# Patient Record
Sex: Female | Born: 2005 | Race: White | Hispanic: No | Marital: Single | Smoking: Never smoker
Health system: Southern US, Community
[De-identification: ages and names within clinical notes are randomized; demographics above are authoritative.]

## PROBLEM LIST (undated history)

## (undated) DIAGNOSIS — B999 Unspecified infectious disease: Secondary | ICD-10-CM

## (undated) DIAGNOSIS — Z789 Other specified health status: Secondary | ICD-10-CM

## (undated) HISTORY — DX: Unspecified infectious disease: B99.9

## (undated) HISTORY — DX: Other specified health status: Z78.9

## (undated) HISTORY — PX: NO PAST SURGERIES: SHX2092

---
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
## 2024-01-08 ENCOUNTER — Encounter: Payer: Self-pay | Admitting: Family Medicine

## 2024-01-08 DIAGNOSIS — O365990 Maternal care for other known or suspected poor fetal growth, unspecified trimester, not applicable or unspecified: Secondary | ICD-10-CM | POA: Insufficient documentation

## 2024-01-12 ENCOUNTER — Ambulatory Visit

## 2024-01-15 ENCOUNTER — Encounter: Payer: Self-pay | Admitting: Nurse Practitioner

## 2024-01-15 ENCOUNTER — Ambulatory Visit: Admitting: Nurse Practitioner

## 2024-01-15 VITALS — BP 99/59 | HR 88 | Temp 98.7°F | Wt 153.2 lb

## 2024-01-15 DIAGNOSIS — R825 Elevated urine levels of drugs, medicaments and biological substances: Secondary | ICD-10-CM

## 2024-01-15 DIAGNOSIS — Z3400 Encounter for supervision of normal first pregnancy, unspecified trimester: Secondary | ICD-10-CM

## 2024-01-15 DIAGNOSIS — Z3403 Encounter for supervision of normal first pregnancy, third trimester: Secondary | ICD-10-CM

## 2024-01-15 DIAGNOSIS — Z3A37 37 weeks gestation of pregnancy: Secondary | ICD-10-CM

## 2024-01-15 DIAGNOSIS — R9389 Abnormal findings on diagnostic imaging of other specified body structures: Secondary | ICD-10-CM

## 2024-01-15 DIAGNOSIS — O365990 Maternal care for other known or suspected poor fetal growth, unspecified trimester, not applicable or unspecified: Secondary | ICD-10-CM

## 2024-01-15 NOTE — Progress Notes (Signed)
  [facility] [address], [address] [place] [address] Main phone: [phone]  Prenatal Visit  Subjective:  [name] is a 18 y.o. G1P0000 at [redacted]w[redacted]d being seen today for ongoing prenatal care.  She is currently monitored for the following issues for this high-risk pregnancy:   Patient Active Problem List   Diagnosis Date Noted   Intrauterine growth restriction (IUGR) affecting care of mother, EFW 8% 01/08/2024   Abnormal finding on ultrasound: single umbilical artery on 09/11/23 u/s 09/11/2023   Vapes nicotine containing substance 08/25/2023   Positive urine drug screen 07/28/23 +MJ; +MJ 08/25/23 08/03/2023   UTI (urinary tract infection) during pregnancy E. Coli 07/28/23 08/03/2023   Supervision of normal first pregnancy, antepartum 07/28/2023   Patient reports no complaints.  Contractions: Not present. Vag. Bleeding: None.  Movement: Present. Denies leaking of fluid/ROM.   The following portions of the patient's history were reviewed and updated as appropriate: allergies, current medications, past family history, past medical history, past social history, past surgical history and problem list. Problem list updated.  Objective:   Vitals:   01/15/24 1438  BP: (!) 99/59  Pulse: 88  Temp: 98.7 F (37.1 C)  Weight: 153 lb 3.2 oz (69.5 kg)    Fetal Status: Fetal Heart Rate (bpm): 140 Fundal Height: 35 cm Movement: Present  Presentation: Vertex  General:  Alert, oriented and cooperative. Patient is in no acute distress.  Skin: Skin is warm and dry. No rash noted.   Cardiovascular: Normal heart rate noted  Respiratory: Normal respiratory effort, no problems with respiration noted  Abdomen: Soft, gravid, appropriate for gestational age.  Pain/Pressure: Absent     Pelvic: Cervical exam deferred      Pt opts to self swab  Extremities: Normal range of motion.  Edema: None  Mental Status: Normal mood and affect.  Normal behavior. Normal judgment and thought content.   Assessment and Plan:  Pregnancy: G1P0000 at [redacted]w[redacted]d  1. Supervision of normal first pregnancy, antepartum (Primary) 37 lb 12.8 oz (17.1 kg) Gained 8lbs in 3 weeks Taking ASA and PNV (forgetting some days) Pt self swabbed  - GBS Culture - Chlamydia/GC NAA, Confirmation  2. [redacted] weeks gestation of pregnancy   3. Positive urine drug screen 07/28/23 +MJ; +MJ 08/25/23 Pt not using MJ anymore  4. Abnormal finding on ultrasound: single umbilical artery on 09/11/23 u/s BPP weekly w [facility] BPP 4/28: 8/8 Next BPP on 5/5  5. Poor fetal growth affecting management of mother, antepartum, single or unspecified fetus Sent IOL request papers for 39 weeks   Term labor symptoms and general obstetric precautions including but not limited to vaginal bleeding, contractions, leaking of fluid and fetal movement were reviewed in detail with the patient. Please refer to After Visit Summary for other counseling recommendations.  Return in about 1 week (around 01/22/2024) for Routine PNC.  No future appointments.  Detria Cummings K Sriya Kroeze, NP

## 2024-01-15 NOTE — Progress Notes (Signed)
 Reminded of upcoming appointment 05/05 @ [facility]. Undecided Pediatrician-patient states will call offices this week. Gery Kubas RN

## 2024-01-19 LAB — CULTURE, BETA STREP (GROUP B ONLY): Strep Gp B Culture: NEGATIVE

## 2024-01-19 LAB — CHLAMYDIA/GC NAA, CONFIRMATION
Chlamydia trachomatis, NAA: NEGATIVE
Neisseria gonorrhoeae, NAA: NEGATIVE

## 2024-01-20 ENCOUNTER — Telehealth: Payer: Self-pay

## 2024-01-20 NOTE — Telephone Encounter (Signed)
 Call to client and notified as per [facility] from [employer] [facility] scheduler, that IOL scheduled for 01/30/24. Counseled [facility] will notify her via phone call between 1100 - 1400 of arrival time. Client verbalized understanding. Ariel Begun, RN

## 2024-01-22 ENCOUNTER — Encounter: Payer: Self-pay | Admitting: Nurse Practitioner

## 2024-01-22 ENCOUNTER — Ambulatory Visit: Admitting: Nurse Practitioner

## 2024-01-22 VITALS — BP 98/62 | HR 84 | Temp 99.1°F | Wt 152.4 lb

## 2024-01-22 DIAGNOSIS — O365990 Maternal care for other known or suspected poor fetal growth, unspecified trimester, not applicable or unspecified: Secondary | ICD-10-CM

## 2024-01-22 DIAGNOSIS — Z3403 Encounter for supervision of normal first pregnancy, third trimester: Secondary | ICD-10-CM

## 2024-01-22 DIAGNOSIS — Z3400 Encounter for supervision of normal first pregnancy, unspecified trimester: Secondary | ICD-10-CM

## 2024-01-22 DIAGNOSIS — R9389 Abnormal findings on diagnostic imaging of other specified body structures: Secondary | ICD-10-CM

## 2024-01-22 DIAGNOSIS — Z3A38 38 weeks gestation of pregnancy: Secondary | ICD-10-CM

## 2024-01-22 NOTE — Progress Notes (Signed)
  [facility] [address] [place] [address] Main phone: [phone]  Prenatal Visit  Subjective:  [name] is a 18 y.o. G1P0000 at [redacted]w[redacted]d being seen today for ongoing prenatal care.  She is currently monitored for the following issues for this low-risk pregnancy:   Patient Active Problem List   Diagnosis Date Noted   Intrauterine growth restriction (IUGR) affecting care of mother, EFW 8% 01/08/2024   Abnormal finding on ultrasound: single umbilical artery on 09/11/23 u/s 09/11/2023   Vapes nicotine containing substance 08/25/2023   Positive urine drug screen 07/28/23 +MJ; +MJ 08/25/23 08/03/2023   UTI (urinary tract infection) during pregnancy E. Coli 07/28/23 08/03/2023   Supervision of normal first pregnancy, antepartum 07/28/2023   Patient reports backache.  Contractions: Not present. Vag. Bleeding: None.  Movement: Present. Denies leaking of fluid/ROM.   The following portions of the patient's history were reviewed and updated as appropriate: allergies, current medications, past family history, past medical history, past social history, past surgical history and problem list. Problem list updated.  Objective:   Vitals:   01/22/24 1551  BP: 98/62  Pulse: 84  Temp: 99.1 F (37.3 C)  Weight: 152 lb 6.4 oz (69.1 kg)    Fetal Status: Fetal Heart Rate (bpm): 150 Fundal Height: 36 cm Movement: Present  Presentation: Vertex  General:  Alert, oriented and cooperative. Patient is in no acute distress.  Skin: Skin is warm and dry. No rash noted.   Cardiovascular: Normal heart rate noted  Respiratory: Normal respiratory effort, no problems with respiration noted  Abdomen: Soft, gravid, appropriate for gestational age.  Pain/Pressure: Absent     Pelvic: Cervical exam deferred        Extremities: Normal range of motion.  Edema: Trace  Mental Status: Normal mood and affect. Normal behavior. Normal  judgment and thought content.   Assessment and Plan:  Pregnancy: G1P0000 at [redacted]w[redacted]d  1. Supervision of normal first pregnancy, antepartum (Primary) 37 lb (16.8 kg) Lost 1 lb in 1 week  Discussed pain control during birth including epidural, nitrous oxide, volunteer doula program at [employer], hydrotherapy, movement, counter pressure.  Rec stretching for backache  2. Abnormal finding on ultrasound: single umbilical artery on 09/11/23 u/s Induction scheduled 5/17  3. Poor fetal growth affecting management of mother, antepartum, single or unspecified fetus BPP at [facility] on 5/5: 8/8 with UA dopplers normal Next appt 5/12  4. [redacted] weeks gestation of pregnancy Reviewed tips to help naturally induce labor including nipple stimulation, sex, eating 7 dates/day, red raspberry leaf tea.  Pt is drinking the tea and walking. Discussed she could have a membrane sweep if desired at next appt    Term labor symptoms and general obstetric precautions including but not limited to vaginal bleeding, contractions, leaking of fluid and fetal movement were reviewed in detail with the patient. Please refer to After Visit Summary for other counseling recommendations.  Return in about 1 week (around 01/29/2024) for Routine PNC.  Future Appointments  Date Time Provider Department Center  01/28/2024  1:40 PM [facility] PROVIDER [facility] None    Dorna Gasman, NP

## 2024-01-26 ENCOUNTER — Encounter: Payer: Self-pay | Admitting: Obstetrics & Gynecology

## 2024-01-26 ENCOUNTER — Inpatient Hospital Stay
Admission: EM | Admit: 2024-01-26 | Discharge: 2024-01-30 | DRG: 787 | Disposition: A | Discharge status: Home / Self Care | Attending: Certified Nurse Midwife | Admitting: Certified Nurse Midwife

## 2024-01-26 ENCOUNTER — Other Ambulatory Visit: Payer: Self-pay

## 2024-01-26 DIAGNOSIS — O4290 Premature rupture of membranes, unspecified as to length of time between rupture and onset of labor, unspecified weeks of gestation: Secondary | ICD-10-CM | POA: Diagnosis present

## 2024-01-26 DIAGNOSIS — O365930 Maternal care for other known or suspected poor fetal growth, third trimester, not applicable or unspecified: Secondary | ICD-10-CM | POA: Diagnosis present

## 2024-01-26 DIAGNOSIS — O365990 Maternal care for other known or suspected poor fetal growth, unspecified trimester, not applicable or unspecified: Secondary | ICD-10-CM | POA: Diagnosis present

## 2024-01-26 DIAGNOSIS — N898 Other specified noninflammatory disorders of vagina: Secondary | ICD-10-CM | POA: Diagnosis present

## 2024-01-26 DIAGNOSIS — Z833 Family history of diabetes mellitus: Secondary | ICD-10-CM | POA: Diagnosis not present

## 2024-01-26 DIAGNOSIS — O4202 Full-term premature rupture of membranes, onset of labor within 24 hours of rupture: Secondary | ICD-10-CM | POA: Diagnosis not present

## 2024-01-26 DIAGNOSIS — O99324 Drug use complicating childbirth: Secondary | ICD-10-CM | POA: Diagnosis present

## 2024-01-26 DIAGNOSIS — R9389 Abnormal findings on diagnostic imaging of other specified body structures: Secondary | ICD-10-CM | POA: Diagnosis present

## 2024-01-26 DIAGNOSIS — D62 Acute posthemorrhagic anemia: Secondary | ICD-10-CM | POA: Diagnosis not present

## 2024-01-26 DIAGNOSIS — Z91013 Allergy to seafood: Secondary | ICD-10-CM | POA: Diagnosis not present

## 2024-01-26 DIAGNOSIS — Z3A38 38 weeks gestation of pregnancy: Secondary | ICD-10-CM | POA: Diagnosis not present

## 2024-01-26 DIAGNOSIS — F1290 Cannabis use, unspecified, uncomplicated: Secondary | ICD-10-CM | POA: Diagnosis present

## 2024-01-26 DIAGNOSIS — O4292 Full-term premature rupture of membranes, unspecified as to length of time between rupture and onset of labor: Secondary | ICD-10-CM | POA: Diagnosis present

## 2024-01-26 DIAGNOSIS — O9902 Anemia complicating childbirth: Secondary | ICD-10-CM | POA: Diagnosis present

## 2024-01-26 DIAGNOSIS — Z3403 Encounter for supervision of normal first pregnancy, third trimester: Secondary | ICD-10-CM | POA: Diagnosis not present

## 2024-01-26 DIAGNOSIS — Z3400 Encounter for supervision of normal first pregnancy, unspecified trimester: Principal | ICD-10-CM

## 2024-01-26 DIAGNOSIS — Z7982 Long term (current) use of aspirin: Secondary | ICD-10-CM

## 2024-01-26 DIAGNOSIS — O9903 Anemia complicating the puerperium: Secondary | ICD-10-CM | POA: Diagnosis not present

## 2024-01-26 DIAGNOSIS — O26899 Other specified pregnancy related conditions, unspecified trimester: Secondary | ICD-10-CM | POA: Diagnosis present

## 2024-01-26 LAB — URINE DRUG SCREEN, QUALITATIVE (ARMC ONLY)
Amphetamines, Ur Screen: NOT DETECTED
Barbiturates, Ur Screen: NOT DETECTED
Benzodiazepine, Ur Scrn: NOT DETECTED
Cannabinoid 50 Ng, Ur ~~LOC~~: POSITIVE — AB
Cocaine Metabolite,Ur ~~LOC~~: NOT DETECTED
MDMA (Ecstasy)Ur Screen: NOT DETECTED
Methadone Scn, Ur: NOT DETECTED
Opiate, Ur Screen: NOT DETECTED
Phencyclidine (PCP) Ur S: NOT DETECTED
Tricyclic, Ur Screen: NOT DETECTED

## 2024-01-26 LAB — ABO/RH: ABO/RH(D): A POS

## 2024-01-26 LAB — CBC
HCT: 35.7 % — ABNORMAL LOW (ref 36.0–46.0)
Hemoglobin: 11.8 g/dL — ABNORMAL LOW (ref 12.0–15.0)
MCH: 27.3 pg (ref 26.0–34.0)
MCHC: 33.1 g/dL (ref 30.0–36.0)
MCV: 82.6 fL (ref 80.0–100.0)
Platelets: 324 10*3/uL (ref 150–400)
RBC: 4.32 MIL/uL (ref 3.87–5.11)
RDW: 13.3 % (ref 11.5–15.5)
WBC: 12.7 10*3/uL — ABNORMAL HIGH (ref 4.0–10.5)
nRBC: 0 % (ref 0.0–0.2)

## 2024-01-26 LAB — TYPE AND SCREEN
ABO/RH(D): A POS
Antibody Screen: NEGATIVE

## 2024-01-26 LAB — RUPTURE OF MEMBRANE (ROM)PLUS: Rom Plus: POSITIVE

## 2024-01-26 MED ORDER — MISOPROSTOL 25 MCG QUARTER TABLET
ORAL_TABLET | ORAL | Status: AC
Start: 2024-01-26 — End: 2024-01-26
  Administered 2024-01-26: 25 ug via ORAL
  Filled 2024-01-26: qty 1

## 2024-01-26 MED ORDER — MISOPROSTOL 25 MCG QUARTER TABLET
ORAL_TABLET | ORAL | Status: AC
  Filled 2024-01-26: qty 2

## 2024-01-26 MED ORDER — MISOPROSTOL 200 MCG PO TABS
ORAL_TABLET | ORAL | Status: AC
  Administered 2024-01-26: 50 ug via ORAL
  Filled 2024-01-26: qty 4

## 2024-01-26 MED ORDER — MISOPROSTOL 50MCG HALF TABLET
50.0000 ug | ORAL_TABLET | ORAL | Status: DC
  Administered 2024-01-26: 50 ug via ORAL

## 2024-01-26 MED ORDER — LIDOCAINE HCL (PF) 1 % IJ SOLN
INTRAMUSCULAR | Status: AC
  Filled 2024-01-26: qty 30

## 2024-01-26 MED ORDER — OXYTOCIN BOLUS FROM INFUSION: 333.0000 mL | Freq: Once | INTRAVENOUS | Status: DC

## 2024-01-26 MED ORDER — SOD CITRATE-CITRIC ACID 500-334 MG/5ML PO SOLN: 30.0000 mL | ORAL | Status: DC | PRN

## 2024-01-26 MED ORDER — ACETAMINOPHEN 325 MG PO TABS: 650.0000 mg | ORAL_TABLET | ORAL | Status: DC | PRN

## 2024-01-26 MED ORDER — HYDROXYZINE HCL 25 MG PO TABS: 50.0000 mg | ORAL_TABLET | Freq: Four times a day (QID) | ORAL | Status: DC | PRN

## 2024-01-26 MED ORDER — LIDOCAINE HCL (PF) 1 % IJ SOLN: 30.0000 mL | INTRAMUSCULAR | Status: DC | PRN

## 2024-01-26 MED ORDER — OXYTOCIN 10 UNIT/ML IJ SOLN
INTRAMUSCULAR | Status: AC
  Filled 2024-01-26: qty 2

## 2024-01-26 MED ORDER — AMMONIA AROMATIC IN INHA
RESPIRATORY_TRACT | Status: AC
Start: 2024-01-26 — End: 2024-01-26
  Filled 2024-01-26: qty 10

## 2024-01-26 MED ORDER — TERBUTALINE SULFATE 1 MG/ML IJ SOLN
0.2500 mg | Freq: Once | INTRAMUSCULAR | Status: AC | PRN
  Administered 2024-01-27: 0.25 mg via SUBCUTANEOUS
  Filled 2024-01-26: qty 1

## 2024-01-26 MED ORDER — ONDANSETRON HCL 4 MG/2ML IJ SOLN
4.0000 mg | Freq: Four times a day (QID) | INTRAMUSCULAR | Status: DC | PRN
  Administered 2024-01-27: 4 mg via INTRAVENOUS
  Filled 2024-01-26: qty 2

## 2024-01-26 MED ORDER — OXYTOCIN-SODIUM CHLORIDE 30-0.9 UT/500ML-% IV SOLN
INTRAVENOUS | Status: AC
  Administered 2024-01-27: 2 m[IU]/min via INTRAVENOUS
  Filled 2024-01-26: qty 500

## 2024-01-26 MED ORDER — OXYTOCIN-SODIUM CHLORIDE 30-0.9 UT/500ML-% IV SOLN
2.5000 [IU]/h | INTRAVENOUS | Status: DC
  Administered 2024-01-27: 30 [IU] via INTRAVENOUS

## 2024-01-26 MED ORDER — OXYTOCIN-SODIUM CHLORIDE 30-0.9 UT/500ML-% IV SOLN
1.0000 m[IU]/min | INTRAVENOUS | Status: DC
  Administered 2024-01-27: 2 m[IU]/min via INTRAVENOUS
  Administered 2024-01-27: 1 m[IU]/min via INTRAVENOUS
  Filled 2024-01-26: qty 500

## 2024-01-26 MED ORDER — MISOPROSTOL 25 MCG QUARTER TABLET
25.0000 ug | ORAL_TABLET | ORAL | Status: DC
  Filled 2024-01-26 (×2): qty 1

## 2024-01-26 MED ORDER — LACTATED RINGERS IV SOLN
500.0000 mL | INTRAVENOUS | Status: AC | PRN
  Administered 2024-01-27: 250 mL via INTRAVENOUS
  Administered 2024-01-27: 1000 mL via INTRAVENOUS
  Administered 2024-01-27: 500 mL via INTRAVENOUS

## 2024-01-26 MED ORDER — FENTANYL CITRATE (PF) 100 MCG/2ML IJ SOLN
50.0000 ug | INTRAMUSCULAR | Status: DC | PRN
  Administered 2024-01-27 (×6): 100 ug via INTRAVENOUS
  Filled 2024-01-26 (×6): qty 2

## 2024-01-26 NOTE — Progress Notes (Signed)
 Labor Progress Note   ASSESSMENT/PLAN   [name] 18 y.o.   G1P0000  at [redacted]w[redacted]d here with PROM.  FWB:  - Fetal well being assessed:   Category I     GBS: - GBS negative  LABOR: - Now in latent labor, doing well. - Pain Management: labor support without medication, options reviewed - Discussed options with patient and will repeat misoprostol. - Anticipate SVD   Active Problems:   Abnormal finding on ultrasound: single umbilical artery on 09/11/23 u/s     Overview: Needs weekly NST or BPP from 36 weeks     Serial growth u/s q 4 wks from 32 wks     12/09/23 @ 32 weeks- EFW 16%, growth lag in extremities without bowing-      ordered follow up for 3 weeks      01/06/24- EFW 8%, BPP 8/8   Intrauterine growth restriction (IUGR) affecting care of mother, EFW 8%     Overview: 01/06/24- consult with MFM at [facility], EFW 8%, BPP 8/8, UA doppler normal          -weekly NST or BPP with UA Doppler weekly [ weekly BPP and dopplers      ordered]     -growth US  q3-4 weeks     Delivery at [redacted]w[redacted]d-[redacted]w[redacted]d with EFW 3-10%   Premature rupture of membranes   PROM (premature rupture of membranes)   SUBJECTIVE/OBJECTIVE   SUBJECTIVE: Feeling more cramping. Has eaten dinner. Partner & grandmother at bedside, supportive.    OBJECTIVE: Vital Signs: Patient Vitals for the past 12 hrs:  BP Temp Temp src Pulse Resp Height Weight  01/26/24 1801 111/62 -- -- 98 15 -- --  01/26/24 1759 -- 97.6 F (36.4 C) Oral -- -- -- --  01/26/24 1630 -- 98.2 F (36.8 C) Oral -- -- -- --  01/26/24 1415 -- 98.1 F (36.7 C) Oral -- -- -- --  01/26/24 1201 -- 98.1 F (36.7 C) Oral -- -- -- --  01/26/24 1026 -- -- -- -- -- 5\' 3"  (1.6 m) 69.4 kg  01/26/24 1025 113/63 -- -- 73 18 -- --    Last SVE:  Dilation: 1 Effacement (%): 60 Station: -2 Presentation: Vertex Exam by:: Binnie Buffalo, CNM -  , Rupture Date: 01/26/24, Rupture Time: 0700,    FHR:   - Mode: External  - Baseline Rate (A): 135 bpm  -    -  Characteristics (ie - accels, decels): Accelerations: 15 x 15  -    UTERINE ACTIVITY:   - Mode: Toco  - Contraction Frequency (min): 2-3/utd minutes

## 2024-01-26 NOTE — H&P (Signed)
 [facility] Labor & Delivery  History and Physical   HPI   Chief Complaint: gush of clear fluid at 7am  [name] is a 18 y.o. G1P0000 at [redacted]w[redacted]d who presents for possible rupture of membranes. She reports gush of clear fluid and continued leaking of clear fluid. Denies painful contractions, reports on & off back pain. Endorses fetal movement. Denies vaginal bleeding. Early & regular prenatal care at [facility], first visit at 12w.  Pregnancy complicated by single umbilical artery, fetal growth restriction.    Pregnancy Complications Patient Active Problem List   Diagnosis Date Noted   Premature rupture of membranes 01/26/2024   PROM (premature rupture of membranes) 01/26/2024   Intrauterine growth restriction (IUGR) affecting care of mother, EFW 8% 01/08/2024   Abnormal finding on ultrasound: single umbilical artery on 09/11/23 u/s 09/11/2023   Vapes nicotine containing substance 08/25/2023   Positive urine drug screen 07/28/23 +MJ; +MJ 08/25/23 08/03/2023   UTI (urinary tract infection) during pregnancy E. Coli 07/28/23 08/03/2023   Supervision of normal first pregnancy, antepartum 07/28/2023    Review of Systems A twelve point review of systems was negative except as stated in HPI.   HISTORY   Medications Medications Prior to Admission  Medication Sig Dispense Refill Last Dose/Taking   aspirin  EC 81 MG tablet Take 1 tablet (81 mg total) by mouth daily. Swallow whole. 120 tablet  Past Month   Prenatal Vit-Fe Fumarate-FA (MULTIVITAMIN-PRENATAL) 27-0.8 MG TABS tablet Take 1 tablet by mouth daily at 12 noon.   Past Month    Allergies is allergic to shellfish allergy.   OB History OB History  Gravida Para Term Preterm AB Living  1 0 0 0 0 0  SAB IAB Ectopic Multiple Live Births  0 0 0 0 0    # Outcome Date GA Lbr Len/2nd Weight Sex Type Anes PTL Lv  1 Current             Past Medical History Past Medical History:  Diagnosis Date    Infection    Patient denies medical problems     Past Surgical History Past Surgical History:  Procedure Laterality Date   NO PAST SURGERIES     NO PAST SURGERIES      Social History  reports that she has never smoked. She has been exposed to tobacco smoke. She has never used smokeless tobacco. She reports that she does not currently use alcohol. She reports that she does not currently use drugs after having used the following drugs: Marijuana.   Family History family history includes Alcohol abuse in her father and mother; Diabetes in her paternal grandmother; Healthy in her father, maternal grandfather, mother, paternal grandfather, and sister; High blood pressure in her paternal grandmother.   PHYSICAL EXAM   Vitals:   01/26/24 1025 01/26/24 1026 01/26/24 1201  BP: 113/63    Pulse: 73    Resp: 18    Temp:   98.1 F (36.7 C)  TempSrc:   Oral  Weight:  153 lb (69.4 kg)   Height:  5\' 3"  (1.6 m)     Constitutional: No acute distress, well appearing, and well nourished. Neurologic: She is alert and conversational.  Psychiatric: She has a normal mood and affect.  Musculoskeletal: Normal gait, grossly normal range of motion Cardiovascular: Normal rate.   Pulmonary/Chest: Normal work of breathing.  Gastrointestinal/Abdominal: Soft. Gravid. There is no tenderness.  Skin: Skin is warm and dry. No rash noted.  Genitourinary: Normal external  female genitalia.  SVE:   Dilation: 1 Effacement (%): 40 Station: -2 Presentation: Vertex Exam by:: Binnie Buffalo CNM   NST Interpretation Indication: SROM Baseline: 140 bpm Variability: moderate Accelerations: present Decelerations: absent Contractions: irregular, every 2-8 minutes Time noted:  See OBIX Impression: reactive Authenticated by: Forestine Igo    PRENATAL LABS FROM OB RESULTS CONSOLE  ABO, Rh: A/Positive/-- (11/12 1049) Antibody: Negative (11/12 1049) Rubella: 2.08 (11/12 1049) RPR: Non Reactive (03/04 1124)   HBsAg: Negative (11/12 1049)  HIV: Non Reactive (11/12 1049)  ZOX:WRUEAVWU/-- (05/02 1500)  ENCOUNTER LABS   Results for orders placed or performed during the hospital encounter of 01/26/24 (from the past 24 hours)  Rupture of Membrane (ROM) Plus     Status: None   Collection Time: 01/26/24 10:31 AM  Result Value Ref Range   Rom Plus POSITIVE         ASSESSMENT AND PLAN   [name] is a 18 y.o. G1P0000 at [redacted]w[redacted]d with EDD: 02/05/2024, by Last Menstrual Period admitted for rupture of membranes.  Misoprostol for ripening, pitocin as needed. Options for pain management reviewed.  Fetal Status: - cephalic presentation by Leopolds, SVE, ultrasound 5/5 - EFW: 2225g 8% at 36w by ultrasound 01/18/24 - CEFM due to fetal growth restriction - FHT currently cat I  Fetal growth restriction, Single umbilical artery - EFW 8%, normal dopplers - CEFM - Placenta to pathology - Notify peds team  Premature rupture of membranes - monitor for infection  GBS negative  Marijuana use in pregnancy - UDS on admission - Notify peds team    Labs/Immunizations: TDAP: 11/17/23, received prenatally Flu: 07/28/23, received prenatally Rubella: immune Varicella unknown HIV: NR Hep B: negative Hep C: NR RPR: NR GBS: negative  Lab Results  Component Value Date   HIV Non Reactive 07/28/2023     Postpartum Plan: - Feeding: Breast Milk - Contraception: plans Progesterone-only OCPs - Prenatal Care Provider: [facility]  Attending Dr. Everardo Hitch was immediately available for the care of the patient.    Future Appointments  Date Time Provider Department Center  01/28/2024  1:40 PM [facility] PROVIDER [facility] None

## 2024-01-26 NOTE — Progress Notes (Signed)
 Pt presents to L/D triage with reported SROM at 0700. Pt reports no bleeding and positive fetal movement.  Last intercourse 5/13. She reports occasional back pain, rated 1/10.  ROM+ positive. CNM notified and patient admitted to L/D.  SVE 1/40/-2. VSS.  A+  GBS-

## 2024-01-27 ENCOUNTER — Encounter
Admission: EM | Disposition: A | Discharge status: Home / Self Care | Payer: Self-pay | Source: Home / Self Care | Attending: Obstetrics & Gynecology

## 2024-01-27 ENCOUNTER — Inpatient Hospital Stay: Admitting: General Practice

## 2024-01-27 ENCOUNTER — Encounter: Payer: Self-pay | Admitting: Obstetrics & Gynecology

## 2024-01-27 DIAGNOSIS — D62 Acute posthemorrhagic anemia: Secondary | ICD-10-CM

## 2024-01-27 DIAGNOSIS — O365930 Maternal care for other known or suspected poor fetal growth, third trimester, not applicable or unspecified: Secondary | ICD-10-CM | POA: Diagnosis not present

## 2024-01-27 DIAGNOSIS — O99324 Drug use complicating childbirth: Secondary | ICD-10-CM | POA: Diagnosis not present

## 2024-01-27 DIAGNOSIS — O4202 Full-term premature rupture of membranes, onset of labor within 24 hours of rupture: Secondary | ICD-10-CM | POA: Diagnosis not present

## 2024-01-27 DIAGNOSIS — Z3403 Encounter for supervision of normal first pregnancy, third trimester: Secondary | ICD-10-CM | POA: Diagnosis not present

## 2024-01-27 DIAGNOSIS — O9903 Anemia complicating the puerperium: Secondary | ICD-10-CM

## 2024-01-27 DIAGNOSIS — F1290 Cannabis use, unspecified, uncomplicated: Secondary | ICD-10-CM

## 2024-01-27 LAB — RPR: RPR Ser Ql: NONREACTIVE

## 2024-01-27 SURGERY — Surgical Case
Anesthesia: Epidural

## 2024-01-27 MED ORDER — MORPHINE SULFATE (PF) 0.5 MG/ML IJ SOLN
INTRAMUSCULAR | Status: DC | PRN
  Administered 2024-01-27: 1.5 mg via EPIDURAL
  Administered 2024-01-27: 1 mg via EPIDURAL

## 2024-01-27 MED ORDER — MENTHOL 3 MG MT LOZG: 1.0000 | LOZENGE | OROMUCOSAL | Status: DC | PRN

## 2024-01-27 MED ORDER — SOD CITRATE-CITRIC ACID 500-334 MG/5ML PO SOLN
ORAL | Status: AC
  Administered 2024-01-27: 30 mL via ORAL
  Filled 2024-01-27: qty 15

## 2024-01-27 MED ORDER — ENOXAPARIN SODIUM 40 MG/0.4ML IJ SOSY
40.0000 mg | PREFILLED_SYRINGE | INTRAMUSCULAR | Status: DC
Start: 2024-01-28 — End: 2024-01-30
  Administered 2024-01-28 – 2024-01-30 (×3): 40 mg via SUBCUTANEOUS
  Filled 2024-01-27 (×3): qty 0.4

## 2024-01-27 MED ORDER — LIDOCAINE 2% (20 MG/ML) 5 ML SYRINGE
INTRAMUSCULAR | Status: DC | PRN
  Administered 2024-01-27 (×3): 5 mL via INTRAVENOUS

## 2024-01-27 MED ORDER — BUPIVACAINE HCL (PF) 0.25 % IJ SOLN
INTRAMUSCULAR | Status: DC | PRN
  Administered 2024-01-27: 60 mL

## 2024-01-27 MED ORDER — ONDANSETRON HCL 4 MG/2ML IJ SOLN: 4.0000 mg | Freq: Three times a day (TID) | INTRAMUSCULAR | Status: DC | PRN

## 2024-01-27 MED ORDER — CARBOPROST TROMETHAMINE 250 MCG/ML IM SOLN
INTRAMUSCULAR | Status: AC
  Filled 2024-01-27: qty 1

## 2024-01-27 MED ORDER — CHLORHEXIDINE GLUCONATE 0.12 % MT SOLN
OROMUCOSAL | Status: AC
Start: 2024-01-27 — End: 2024-01-27
  Administered 2024-01-27: 15 mL
  Filled 2024-01-27: qty 15

## 2024-01-27 MED ORDER — GABAPENTIN 300 MG PO CAPS
300.0000 mg | ORAL_CAPSULE | Freq: Once | ORAL | Status: AC
  Administered 2024-01-27: 300 mg via ORAL
  Filled 2024-01-27: qty 1

## 2024-01-27 MED ORDER — PHENYLEPHRINE 80 MCG/ML (10ML) SYRINGE FOR IV PUSH (FOR BLOOD PRESSURE SUPPORT): 80.0000 ug | PREFILLED_SYRINGE | INTRAVENOUS | Status: DC | PRN

## 2024-01-27 MED ORDER — MEPERIDINE HCL 25 MG/ML IJ SOLN: 6.2500 mg | INTRAMUSCULAR | Status: DC | PRN

## 2024-01-27 MED ORDER — KETOROLAC TROMETHAMINE 30 MG/ML IJ SOLN
30.0000 mg | Freq: Four times a day (QID) | INTRAMUSCULAR | Status: AC
  Administered 2024-01-28 (×3): 30 mg via INTRAVENOUS
  Filled 2024-01-27 (×5): qty 1

## 2024-01-27 MED ORDER — KETOROLAC TROMETHAMINE 30 MG/ML IJ SOLN
INTRAMUSCULAR | Status: DC | PRN
Start: 2024-01-27 — End: 2024-01-27
  Administered 2024-01-27: 30 mg via INTRAVENOUS

## 2024-01-27 MED ORDER — SIMETHICONE 80 MG PO CHEW
80.0000 mg | CHEWABLE_TABLET | ORAL | Status: DC | PRN
  Administered 2024-01-29: 80 mg via ORAL
  Filled 2024-01-27: qty 1

## 2024-01-27 MED ORDER — MORPHINE SULFATE (PF) 0.5 MG/ML IJ SOLN
INTRAMUSCULAR | Status: DC | PRN
  Administered 2024-01-27: 2.5 mg via INTRAVENOUS

## 2024-01-27 MED ORDER — KETOROLAC TROMETHAMINE 30 MG/ML IJ SOLN
30.0000 mg | Freq: Four times a day (QID) | INTRAMUSCULAR | Status: AC | PRN
  Administered 2024-01-27 – 2024-01-28 (×2): 30 mg via INTRAVENOUS

## 2024-01-27 MED ORDER — MISOPROSTOL 200 MCG PO TABS
ORAL_TABLET | ORAL | Status: AC
  Filled 2024-01-27: qty 5

## 2024-01-27 MED ORDER — CEFAZOLIN SODIUM-DEXTROSE 2-4 GM/100ML-% IV SOLN
2.0000 g | INTRAVENOUS | Status: AC
Start: 2024-01-27 — End: 2024-01-27
  Administered 2024-01-27: 2 g via INTRAVENOUS

## 2024-01-27 MED ORDER — PROMETHAZINE HCL 25 MG/ML IJ SOLN
INTRAMUSCULAR | Status: AC
  Filled 2024-01-27: qty 1

## 2024-01-27 MED ORDER — SOD CITRATE-CITRIC ACID 500-334 MG/5ML PO SOLN: 30.0000 mL | ORAL | Status: AC

## 2024-01-27 MED ORDER — DEXMEDETOMIDINE HCL IN NACL 80 MCG/20ML IV SOLN
INTRAVENOUS | Status: DC | PRN
  Administered 2024-01-27: 12 ug via INTRAVENOUS
  Administered 2024-01-27: 8 ug via INTRAVENOUS

## 2024-01-27 MED ORDER — BUPIVACAINE HCL (PF) 0.25 % IJ SOLN
INTRAMUSCULAR | Status: AC
  Filled 2024-01-27: qty 60

## 2024-01-27 MED ORDER — OXYCODONE HCL 5 MG PO TABS: 5.0000 mg | ORAL_TABLET | ORAL | Status: DC | PRN

## 2024-01-27 MED ORDER — SIMETHICONE 80 MG PO CHEW
80.0000 mg | CHEWABLE_TABLET | Freq: Three times a day (TID) | ORAL | Status: DC
  Administered 2024-01-28 – 2024-01-30 (×6): 80 mg via ORAL
  Filled 2024-01-27 (×7): qty 1

## 2024-01-27 MED ORDER — PHENYLEPHRINE HCL-NACL 20-0.9 MG/250ML-% IV SOLN
INTRAVENOUS | Status: AC
  Filled 2024-01-27: qty 250

## 2024-01-27 MED ORDER — COCONUT OIL OIL
1.0000 | TOPICAL_OIL | Status: DC | PRN
  Filled 2024-01-27 (×2): qty 7.5

## 2024-01-27 MED ORDER — OXYTOCIN-SODIUM CHLORIDE 30-0.9 UT/500ML-% IV SOLN
2.5000 [IU]/h | INTRAVENOUS | Status: AC
  Administered 2024-01-27: 2.5 [IU]/h via INTRAVENOUS

## 2024-01-27 MED ORDER — DEXAMETHASONE SODIUM PHOSPHATE 10 MG/ML IJ SOLN
INTRAMUSCULAR | Status: DC | PRN
  Administered 2024-01-27: 10 mg via INTRAVENOUS

## 2024-01-27 MED ORDER — SODIUM CHLORIDE 0.9 % IV SOLN
INTRAVENOUS | Status: DC | PRN
  Administered 2024-01-27 (×2): 4 mL via EPIDURAL

## 2024-01-27 MED ORDER — SCOPOLAMINE 1 MG/3DAYS TD PT72: 1.0000 | MEDICATED_PATCH | Freq: Once | TRANSDERMAL | Status: DC

## 2024-01-27 MED ORDER — SODIUM CHLORIDE (PF) 0.9 % IJ SOLN
INTRAMUSCULAR | Status: AC
  Filled 2024-01-27: qty 50

## 2024-01-27 MED ORDER — ACETAMINOPHEN 500 MG PO TABS
1000.0000 mg | ORAL_TABLET | Freq: Once | ORAL | Status: AC
  Administered 2024-01-27: 1000 mg via ORAL
  Filled 2024-01-27: qty 2

## 2024-01-27 MED ORDER — PRENATAL MULTIVITAMIN CH
1.0000 | ORAL_TABLET | Freq: Every day | ORAL | Status: DC
  Administered 2024-01-28 – 2024-01-30 (×3): 1 via ORAL
  Filled 2024-01-27 (×3): qty 1

## 2024-01-27 MED ORDER — FENTANYL-BUPIVACAINE-NACL 0.5-0.125-0.9 MG/250ML-% EP SOLN
EPIDURAL | Status: AC
  Filled 2024-01-27: qty 250

## 2024-01-27 MED ORDER — DIBUCAINE (PERIANAL) 1 % EX OINT: 1.0000 | TOPICAL_OINTMENT | CUTANEOUS | Status: DC | PRN

## 2024-01-27 MED ORDER — IBUPROFEN 600 MG PO TABS
600.0000 mg | ORAL_TABLET | Freq: Four times a day (QID) | ORAL | Status: DC
Start: 2024-01-29 — End: 2024-01-30
  Administered 2024-01-29 – 2024-01-30 (×7): 600 mg via ORAL
  Filled 2024-01-27 (×10): qty 1

## 2024-01-27 MED ORDER — ACETAMINOPHEN 500 MG PO TABS
1000.0000 mg | ORAL_TABLET | Freq: Four times a day (QID) | ORAL | Status: DC
  Administered 2024-01-27 – 2024-01-30 (×10): 1000 mg via ORAL
  Filled 2024-01-27 (×9): qty 2

## 2024-01-27 MED ORDER — LACTATED RINGERS IV SOLN
500.0000 mL | Freq: Once | INTRAVENOUS | Status: AC
  Administered 2024-01-27: 500 mL via INTRAVENOUS

## 2024-01-27 MED ORDER — PHENYLEPHRINE HCL-NACL 20-0.9 MG/250ML-% IV SOLN
INTRAVENOUS | Status: DC | PRN
  Administered 2024-01-27: 40 ug/min via INTRAVENOUS

## 2024-01-27 MED ORDER — METHYLERGONOVINE MALEATE 0.2 MG/ML IJ SOLN
INTRAMUSCULAR | Status: DC | PRN
  Administered 2024-01-27: .2 mg via INTRAMUSCULAR

## 2024-01-27 MED ORDER — DIPHENHYDRAMINE HCL 50 MG/ML IJ SOLN
12.5000 mg | INTRAMUSCULAR | Status: DC | PRN
  Administered 2024-01-27: 12.5 mg via INTRAVENOUS
  Filled 2024-01-27: qty 1

## 2024-01-27 MED ORDER — NALOXONE HCL 4 MG/10ML IJ SOLN: 1.0000 ug/kg/h | INTRAVENOUS | Status: DC | PRN

## 2024-01-27 MED ORDER — MORPHINE SULFATE (PF) 0.5 MG/ML IJ SOLN
INTRAMUSCULAR | Status: AC
  Filled 2024-01-27: qty 10

## 2024-01-27 MED ORDER — DIPHENHYDRAMINE HCL 50 MG/ML IJ SOLN: 12.5000 mg | INTRAMUSCULAR | Status: DC | PRN

## 2024-01-27 MED ORDER — SENNOSIDES-DOCUSATE SODIUM 8.6-50 MG PO TABS
2.0000 | ORAL_TABLET | Freq: Every day | ORAL | Status: DC
  Administered 2024-01-28 – 2024-01-30 (×3): 2 via ORAL
  Filled 2024-01-27 (×3): qty 2

## 2024-01-27 MED ORDER — EPHEDRINE 5 MG/ML INJ: 10.0000 mg | INTRAVENOUS | Status: DC | PRN

## 2024-01-27 MED ORDER — PHENYLEPHRINE 80 MCG/ML (10ML) SYRINGE FOR IV PUSH (FOR BLOOD PRESSURE SUPPORT)
PREFILLED_SYRINGE | INTRAVENOUS | Status: DC | PRN
  Administered 2024-01-27: 80 ug via INTRAVENOUS

## 2024-01-27 MED ORDER — ONDANSETRON HCL 4 MG/2ML IJ SOLN
INTRAMUSCULAR | Status: DC | PRN
  Administered 2024-01-27: 4 mg via INTRAVENOUS

## 2024-01-27 MED ORDER — ACETAMINOPHEN 500 MG PO TABS
1000.0000 mg | ORAL_TABLET | Freq: Four times a day (QID) | ORAL | Status: AC
  Filled 2024-01-27 (×3): qty 2

## 2024-01-27 MED ORDER — ZOLPIDEM TARTRATE 5 MG PO TABS: 5.0000 mg | ORAL_TABLET | Freq: Every evening | ORAL | Status: DC | PRN

## 2024-01-27 MED ORDER — LACTATED RINGERS IV SOLN: INTRAVENOUS | Status: AC

## 2024-01-27 MED ORDER — FENTANYL-BUPIVACAINE-NACL 0.5-0.125-0.9 MG/250ML-% EP SOLN
12.0000 mL/h | EPIDURAL | Status: DC | PRN
  Administered 2024-01-27: 12 mL/h via EPIDURAL

## 2024-01-27 MED ORDER — DIPHENHYDRAMINE HCL 25 MG PO CAPS: 25.0000 mg | ORAL_CAPSULE | ORAL | Status: DC | PRN

## 2024-01-27 MED ORDER — LACTATED RINGERS AMNIOINFUSION
INTRAVENOUS | Status: DC
  Filled 2024-01-27 (×2): qty 1000

## 2024-01-27 MED ORDER — MORPHINE SULFATE (PF) 2 MG/ML IV SOLN: 1.0000 mg | INTRAVENOUS | Status: DC | PRN

## 2024-01-27 MED ORDER — CEFAZOLIN SODIUM-DEXTROSE 2-4 GM/100ML-% IV SOLN
INTRAVENOUS | Status: AC
  Filled 2024-01-27: qty 100

## 2024-01-27 MED ORDER — DIPHENHYDRAMINE HCL 25 MG PO CAPS: 25.0000 mg | ORAL_CAPSULE | Freq: Four times a day (QID) | ORAL | Status: DC | PRN

## 2024-01-27 MED ORDER — SODIUM CHLORIDE 0.9 % IV SOLN
500.0000 mg | INTRAVENOUS | Status: AC
  Administered 2024-01-27: 500 mg via INTRAVENOUS
  Filled 2024-01-27: qty 5

## 2024-01-27 MED ORDER — OXYCODONE HCL 5 MG PO TABS: 5.0000 mg | ORAL_TABLET | Freq: Four times a day (QID) | ORAL | Status: DC | PRN

## 2024-01-27 MED ORDER — NALOXONE HCL 0.4 MG/ML IJ SOLN: 0.4000 mg | INTRAMUSCULAR | Status: DC | PRN

## 2024-01-27 MED ORDER — METHYLERGONOVINE MALEATE 0.2 MG/ML IJ SOLN
INTRAMUSCULAR | Status: AC
  Filled 2024-01-27: qty 1

## 2024-01-27 MED ORDER — SODIUM CHLORIDE 0.9% FLUSH: 3.0000 mL | INTRAVENOUS | Status: DC | PRN

## 2024-01-27 MED ORDER — PROPOFOL 10 MG/ML IV BOLUS
INTRAVENOUS | Status: AC
  Filled 2024-01-27: qty 20

## 2024-01-27 MED ORDER — LIDOCAINE-EPINEPHRINE (PF) 1.5 %-1:200000 IJ SOLN
INTRAMUSCULAR | Status: DC | PRN
  Administered 2024-01-27: 3 mL via PERINEURAL

## 2024-01-27 MED ORDER — KETOROLAC TROMETHAMINE 30 MG/ML IJ SOLN: 30.0000 mg | Freq: Four times a day (QID) | INTRAMUSCULAR | Status: AC | PRN

## 2024-01-27 MED ORDER — WITCH HAZEL-GLYCERIN EX PADS: 1.0000 | MEDICATED_PAD | CUTANEOUS | Status: DC | PRN

## 2024-01-27 MED ORDER — GABAPENTIN 300 MG PO CAPS
300.0000 mg | ORAL_CAPSULE | Freq: Every day | ORAL | Status: DC
  Administered 2024-01-27 – 2024-01-29 (×3): 300 mg via ORAL
  Filled 2024-01-27 (×3): qty 1

## 2024-01-27 SURGICAL SUPPLY — 27 items
CHLORAPREP W/TINT 26 (MISCELLANEOUS) ×1 IMPLANT
DRSG TELFA 3X8 NADH (GAUZE/BANDAGES/DRESSINGS) ×1 IMPLANT
DRSG TELFA 3X8 NADH STRL (GAUZE/BANDAGES/DRESSINGS) ×1 IMPLANT
ELECT CAUTERY BLADE 6.4 (BLADE) ×1 IMPLANT
ELECTRODE REM PT RTRN 9FT ADLT (ELECTROSURGICAL) ×1 IMPLANT
GAUZE SPONGE 4X4 12PLY STRL (GAUZE/BANDAGES/DRESSINGS) ×1 IMPLANT
GLOVE SURG SYN 8.0 (GLOVE) ×1 IMPLANT
GLOVE SURG SYN 8.0 PF PI (GLOVE) ×1 IMPLANT
GOWN STRL REUS W/ TWL LRG LVL3 (GOWN DISPOSABLE) ×2 IMPLANT
GOWN STRL REUS W/ TWL XL LVL3 (GOWN DISPOSABLE) ×1 IMPLANT
MANIFOLD NEPTUNE II (INSTRUMENTS) ×1 IMPLANT
MAT PREVALON FULL STRYKER (MISCELLANEOUS) ×1 IMPLANT
NDL HYPO 22X1.5 SAFETY MO (MISCELLANEOUS) ×1 IMPLANT
NEEDLE HYPO 22X1.5 SAFETY MO (MISCELLANEOUS) ×1 IMPLANT
NS IRRIG 1000ML POUR BTL (IV SOLUTION) ×1 IMPLANT
PACK C SECTION AR (MISCELLANEOUS) ×1 IMPLANT
PAD DRESSING TELFA 3X8 NADH (GAUZE/BANDAGES/DRESSINGS) IMPLANT
PAD OB MATERNITY 11 LF (PERSONAL CARE ITEMS) ×1 IMPLANT
PAD PREP OB/GYN DISP 24X41 (PERSONAL CARE ITEMS) ×1 IMPLANT
SCRUB CHG 4% DYNA-HEX 4OZ (MISCELLANEOUS) ×1 IMPLANT
STRAP SAFETY 5IN WIDE (MISCELLANEOUS) ×1 IMPLANT
SUT CHROMIC 1 CTX 36 (SUTURE) ×3 IMPLANT
SUT PLAIN GUT 0 (SUTURE) ×2 IMPLANT
SUT VIC AB 0 CT1 36 (SUTURE) ×2 IMPLANT
SYR 30ML LL (SYRINGE) ×2 IMPLANT
TRAP FLUID SMOKE EVACUATOR (MISCELLANEOUS) ×1 IMPLANT
WATER STERILE IRR 500ML POUR (IV SOLUTION) ×1 IMPLANT

## 2024-01-27 NOTE — Progress Notes (Signed)
 Labor Progress Note   ASSESSMENT/PLAN   [name] 18 y.o.   G1P0000  at [redacted]w[redacted]d here for PROM and FGR.  FWB:  - Fetal well being assessed: Category 2        GBS: - GBS negative  LABOR: -  Early labor, recurrent decelerations - Amnioinfusion running - Pitocin infusing at 2 mU/min - Reviewed FHR tracing with Dr. Madelene Schanz. He recommends continuing to titrate the Pitocin for adequate contractions. - Pain Management: Epidural - Anticipate SVD   Labor Progress Cervical Exam  Last 3 exams Dil Eff Sta Exam Time  4.5 -- -- 1158  4 70 -1 0952  2 90 -1 0551      SUBJECTIVE/OBJECTIVE   SUBJECTIVE:  [name] is resting comfortably  OBJECTIVE: Vital Signs: Patient Vitals for the past 12 hrs:  BP Temp Temp src Pulse Resp SpO2  01/27/24 1136 (!) 91/48 -- -- 69 -- --  01/27/24 1130 -- 97.9 F (36.6 C) Oral -- -- --  01/27/24 1125 -- -- -- -- -- 92 %  01/27/24 1123 (!) 96/42 -- -- 67 -- --  01/27/24 1040 -- -- -- -- -- 100 %  01/27/24 1036 (!) 119/59 -- -- 81 -- --  01/27/24 1029 120/67 -- -- 90 -- --  01/27/24 1020 -- -- -- -- -- 98 %  01/27/24 0912 -- 98.1 F (36.7 C) Oral -- -- --  01/27/24 0754 (!) 96/45 -- -- 67 -- --  01/27/24 0720 (!) 87/48 -- -- 68 15 --  01/27/24 0719 -- 97.7 F (36.5 C) Oral -- -- --  01/27/24 0555 -- -- -- -- -- 99 %  01/27/24 0146 115/75 98 F (36.7 C) Oral 75 16 --    Last SVE:  Dilation: 4 Effacement (%): 70 Cervical Position: Middle Station: -1 Presentation: Vertex Exam by:: Cinda Craze CNM -  , Rupture Date: 01/26/24, Rupture Time: 0700,    FHR:   - Baseline: - Variability: moderate - Accels: present - Decels: variable, early Category 2  UTERINE ACTIVITY:  Contractions:  Phylliss Brenner, CNM

## 2024-01-27 NOTE — Progress Notes (Signed)
 Labor Progress Note   ASSESSMENT/PLAN   [name] 18 y.o.   G1P0000  at [redacted]w[redacted]d here for PROM and FGR.  FWB:  - Fetal well being assessed: Category 2        GBS: - GBS negative  LABOR: -  Cervical ripening - Reviewed patient with Dr. Madelene Schanz. P - Discussed recommendations for continuous fetal monitoring and accurate contraction tracing. Reviewed risks and benefits of potential interventions and obtained verbal consent. Cook catheter removed and internal monitors placed. Will get epidural placed and titrate Pitocin. Discussed potential for cesarean birth if baby does not tolerate labor. Consider amnioinfusion if indicated. - IUPC in place. FSE placed but is not tracing d/t faulty equipment. Will continue external fetal monitoring - Pain Management: IV pain meds and plans epidural - Anticipate SVD   Labor Progress Cervical Exam  Last 3 exams Dil Eff Sta Exam Time  4 70 -1 0952. Cook removed, IUPC placed  2 90 -1 0551  2.5 80 -1 0358     SUBJECTIVE/OBJECTIVE   SUBJECTIVE:  [name] is feeling painful contractions. She is tired and ready for her epidural.  OBJECTIVE: Vital Signs: Patient Vitals for the past 12 hrs:  BP Temp Temp src Pulse Resp SpO2  01/27/24 0912 -- 98.1 F (36.7 C) Oral -- -- --  01/27/24 0754 (!) 96/45 -- -- 67 -- --  01/27/24 0720 (!) 87/48 -- -- 68 15 --  01/27/24 0719 -- 97.7 F (36.5 C) Oral -- -- --  01/27/24 0555 -- -- -- -- -- 99 %  01/27/24 0146 115/75 98 F (36.7 C) Oral 75 16 --    Last SVE:  Dilation: 4 Effacement (%): 90 Cervical Position: Middle Station: -1 Presentation: Vertex Exam by:: [name] CNM -  , Rupture Date: 01/26/24, Rupture Time: 0700,    FHR:   - Baseline: 130 - Variability: moderate - Accels: present - Decels: variable/?late Category 2  UTERINE ACTIVITY:  Contractions:  q 6 min  Phylliss Brenner, CNM

## 2024-01-27 NOTE — Discharge Summary (Signed)
 Postpartum Discharge Summary  Date of Service updated 01/30/2024     Patient Name: [name] DOB: January 08, 2006 MRN: [id number]  Date of admission: 01/26/2024 Delivery date:01/27/2024 Delivering provider: Marvell Slider  Date of discharge: 01/30/2024  Admitting diagnosis: Vaginal discharge during pregnancy [O26.899, N89.8] PROM (premature rupture of membranes) [O42.90] Intrauterine pregnancy: [redacted]w[redacted]d     Secondary diagnosis:  Active Problems:   Abnormal finding on ultrasound: single umbilical artery on 09/11/23 u/s   Intrauterine growth restriction (IUGR) affecting care of mother, EFW 8%   Premature rupture of membranes   PROM (premature rupture of membranes)  Additional problems: Anemia in pregnancy    Discharge diagnosis: Term Pregnancy Delivered                                              Post partum procedures:iron  infusion Augmentation: Pitocin , Cytotec , and IP Foley Complications: fetal intolerance, prolonged ROM  Hospital course: Onset of Labor With Unplanned C/S   18 y.o. yo G1P1001 at [redacted]w[redacted]d was admitted in Latent Labor after PROM on 01/26/2024. Patient had a labor course significant for prolonged ROM and fetal intolerance. The patient went for cesarean section due to Non-Reassuring FHR. Delivery details as follows: Membrane Rupture Time/Date: 7:00 AM,01/26/2024  Delivery Method:C-Section, Low Transverse Operative Delivery:N/A Details of operation can be found in separate operative note. Patient had a postpartum course uncomplicated .  She is ambulating,tolerating a regular diet, passing flatus, and urinating well.  Patient is discharged home in stable condition 01/30/24.  Newborn Data: Birth date:01/27/2024 Birth time:1:47 PM Gender:Female Living status:Living Apgars:9 ,9  Weight:2380 g  Magnesium Sulfate received: No BMZ received: No Rhophylac:N/A MMR:N/A T-DaP:Given prenatally Flu: No RSV Vaccine received: No Transfusion:No Immunizations  administered: Immunization History  Administered Date(s) Administered   Influenza-Unspecified 07/28/2023   Tdap 11/17/2023    Physical exam  Vitals:   01/29/24 1608 01/29/24 1956 01/30/24 0005 01/30/24 0909  BP: 106/65 105/60 120/83 120/63  Pulse: 86 81 (!) 105 99  Resp: 18 18 18 20   Temp: 98.7 F (37.1 C) 98.6 F (37 C) 97.9 F (36.6 C) 98.7 F (37.1 C)  TempSrc: Oral Oral Oral Oral  SpO2: 99% 99% 100% 98%  Weight:      Height:       General: alert, cooperative, and no distress Lochia: appropriate Uterine Fundus: firm Incision: Dressing is clean, dry, and intact, honeycomb DVT Evaluation: No evidence of DVT seen on physical exam. Negative Homan's sign. No cords or calf tenderness. No significant calf/ankle edema. Labs: Lab Results  Component Value Date   WBC 16.1 (H) 01/28/2024   HGB 8.6 (L) 01/28/2024   HCT 27.1 (L) 01/28/2024   MCV 84.2 01/28/2024   PLT 251 01/28/2024       No data to display         Edinburgh Score:    01/28/2024    2:31 PM  Edinburgh Postnatal Depression Scale Screening Tool  I have been able to laugh and see the funny side of things. 0  I have looked forward with enjoyment to things. 0  I have blamed myself unnecessarily when things went wrong. 1  I have been anxious or worried for no good reason. 1  I have felt scared or panicky for no good reason. 0  Things have been getting on top of me. 1  I have been so  unhappy that I have had difficulty sleeping. 0  I have felt sad or miserable. 0  I have been so unhappy that I have been crying. 0  The thought of harming myself has occurred to me. 0  Edinburgh Postnatal Depression Scale Total 3      After visit meds:  Allergies as of 01/30/2024       Reactions   Shellfish Allergy Hives, Shortness Of Breath        Medication List     STOP taking these medications    aspirin  EC 81 MG tablet       TAKE these medications    multivitamin-prenatal 27-0.8 MG Tabs tablet Take 1  tablet by mouth daily at 12 noon.   oxyCODONE  5 MG immediate release tablet Commonly known as: Oxy IR/ROXICODONE  Take 1-2 tablets (5-10 mg total) by mouth every 4 (four) hours as needed for moderate pain (pain score 4-6).         Discharge home in stable condition Infant Feeding: Bottle and Breast Infant Disposition:home with mother Discharge instruction: per After Visit Summary and Postpartum booklet. Activity: Advance as tolerated. Pelvic rest for 6 weeks.  Diet: routine diet Anticipated Birth Control: POPs Postpartum Appointment:1 week Additional Postpartum F/U: Incision check 1 week Future Appointments: No future appointments.  Follow up Visit: With Melissa Swanson CNM       01/30/2024 Sorina Derrig, CNM

## 2024-01-27 NOTE — Progress Notes (Signed)
 S. She is starting to feel pain with her contractions, wants an epidural at some point.  O. VSS, AF FHR- 120s, good variabilty, occasional variable deceleration with initiation of pitocin. Pit stopped and Cook's balloon placed CVX- c/90/-2/vertex EFW- less than 5# Pelvis- adequate  A/P. IUGR- early labor We discussed the fetal heart decelerations and the possible need for a cesarean section in the future. Risks and benefits explained, understood and accepted.

## 2024-01-27 NOTE — Anesthesia Procedure Notes (Signed)
 Epidural Patient location during procedure: OB Start time: 01/27/2024 10:24 AM End time: 01/27/2024 10:27 AM  Staffing Anesthesiologist: Enrique Harvest, MD Performed: anesthesiologist   Preanesthetic Checklist Completed: patient identified, IV checked, site marked, risks and benefits discussed, surgical consent, monitors and equipment checked, pre-op evaluation and timeout performed  Epidural Patient position: sitting Prep: Betadine Patient monitoring: heart rate, continuous pulse ox and blood pressure Approach: midline Location: L3-L4 Injection technique: LOR saline  Needle:  Needle type: Tuohy  Needle gauge: 18 G Needle length: 9 cm and 9 Catheter type: closed end flexible Catheter size: 20 Guage Test dose: negative and 1.5% lidocaine with Epi 1:200 K  Assessment Sensory level: T4 Events: blood not aspirated, no cerebrospinal fluid, injection not painful, no injection resistance, no paresthesia and negative IV test  Additional Notes 1 attempt Pt. Evaluated and documentation done after procedure finished. Patient identified. Risks/Benefits/Options discussed with patient including but not limited to bleeding, infection, nerve damage, paralysis, failed block, incomplete pain control, headache, blood pressure changes, nausea, vomiting, reactions to medication both or allergic, itching and postpartum back pain. Confirmed with bedside nurse the patient's most recent platelet count. Confirmed with patient that they are not currently taking any anticoagulation, have any bleeding history or any family history of bleeding disorders. Patient expressed understanding and wished to proceed. All questions were answered. Sterile technique was used throughout the entire procedure. Please see nursing notes for vital signs. Test dose was given through epidural catheter and negative prior to continuing to dose epidural or start infusion. Warning signs of high block given to the patient including  shortness of breath, tingling/numbness in hands, complete motor block, or any concerning symptoms with instructions to call for help. Patient was given instructions on fall risk and not to get out of bed. All questions and concerns addressed with instructions to call with any issues or inadequate analgesia.    Patient tolerated the insertion well without immediate complications. Reason for block:procedure for pain

## 2024-01-27 NOTE — Progress Notes (Signed)
 Repetitive variable decelerations to the 60s noted. Pitocin discontinued, IV fluid bolus given. Amnioinfusion still running. Riannon repositioned. Dr. Madelene Schanz notified and requested to review FHR tracing.  Lion Fernandez M Meryn Sarracino, CNM

## 2024-01-27 NOTE — Lactation Note (Signed)
 This note was copied from a baby's chart. Lactation Consultation Note  Patient Name: [name] [id number] Date: 01/27/2024 Age:19 hours Reason for consult: Initial assessment;Primapara;Early term 37-38.6wks;Infant < 6lbs;RN request   Maternal Data This is 66 year old mom's 1st baby, primary C/S for FHR indication. Mom with history of MJ use 07/28/23 and 08/25/23 (UDS positive on admission), nicotine vapiong, fetal growth restriction.  Requested by care nurse to assist mom who was concerned she doesn't have enough milk and doesn't know if the baby is getting enough. Mom independently positioned and latched baby prior to [facility] arriving in room. Has patient been taught Hand Expression?: Yes Does the patient have breastfeeding experience prior to this delivery?: No  Feeding Mother's Current Feeding Choice: Breast Milk Provided tips and strategies on how to wake a sleepy baby and keep baby interested at the breast. Discussed how to know the baby is getting enough and what to expect in the first days when breastfeeding including 8-12 feeds in 24 hours, feeding cues, and cluster feeding.  LATCH Score Latch: Repeated attempts needed to sustain latch, nipple held in mouth throughout feeding, stimulation needed to elicit sucking reflex.  Audible Swallowing: A few with stimulation  Type of Nipple: Everted at rest and after stimulation  Comfort (Breast/Nipple): Soft / non-tender  Hold (Positioning): No assistance needed to correctly position infant at breast. (Mom had independently latched baby prior to [facility] entering the room.)  LATCH Score: 8   Interventions Interventions: Breast feeding basics reviewed;Education  Update provided to care nurse.  Consult Status Consult Status: Follow-up Date: 01/28/24 Follow-up type: In-patient    Angelica Kemp 01/27/2024, 9:59 PM

## 2024-01-27 NOTE — Progress Notes (Signed)
 Labor Progress Note   ASSESSMENT/PLAN   [name] 17 y.o.   G1P0000  at [redacted]w[redacted]d here for PROM and FGR.  FWB:  - Fetal well being assessed: Category 2        GBS: - GBS negative  LABOR: -  Latent labor, fetal intolerance of contractions - Recurrent deep variable with minimal cervical change. - Consulted Dr. Madelene Schanz to review FHR tracing. He recommends proceeding with cesarean birth at this time. [name] and her partner are in agreement with this plan. Questions answered. -Will give terbutaline and prophylactic antibiotics. - Pain Management: Epidural - Proceed with cesarean birth   SUBJECTIVE/OBJECTIVE   SUBJECTIVE:  Variable decels to the 60s-70s continue despite position changes, amnioinfusion, fluid bolus, and discontinuing Pitocin.   OBJECTIVE: Vital Signs: Patient Vitals for the past 12 hrs:  BP Temp Temp src Pulse Resp SpO2  01/27/24 1240 -- -- -- -- -- 100 %  01/27/24 1235 -- -- -- -- -- 100 %  01/27/24 1230 -- -- -- -- -- 99 %  01/27/24 1225 -- -- -- -- -- 99 %  01/27/24 1215 -- -- -- -- -- 100 %  01/27/24 1210 -- -- -- -- -- 100 %  01/27/24 1205 -- -- -- -- -- 100 %  01/27/24 1200 -- -- -- -- -- 100 %  01/27/24 1155 -- -- -- -- -- 100 %  01/27/24 1151 104/64 -- -- 69 -- --  01/27/24 1150 -- -- -- -- -- 100 %  01/27/24 1145 -- -- -- -- -- 100 %  01/27/24 1140 -- -- -- -- -- 100 %  01/27/24 1136 (!) 91/48 -- -- 69 -- --  01/27/24 1135 -- -- -- -- -- 100 %  01/27/24 1130 -- 97.9 F (36.6 C) Oral -- -- 100 %  01/27/24 1125 -- -- -- -- -- 92 %  01/27/24 1123 (!) 96/42 -- -- 67 -- --  01/27/24 1120 -- -- -- -- -- 96 %  01/27/24 1115 -- -- -- -- -- 91 %  01/27/24 1110 -- -- -- -- -- 100 %  01/27/24 1106 102/68 -- -- 73 -- --  01/27/24 1105 -- -- -- -- -- 100 %  01/27/24 1100 -- -- -- -- -- 100 %  01/27/24 1055 -- -- -- -- -- 100 %  01/27/24 1051 103/60 -- -- 71 -- --  01/27/24 1046 (!) 104/56 -- -- 75 -- --  01/27/24 1045 -- -- -- -- -- 100 %   01/27/24 1041 (!) 104/55 -- -- 66 -- --  01/27/24 1040 -- -- -- -- -- 100 %  01/27/24 1036 (!) 119/59 -- -- 81 -- --  01/27/24 1035 -- -- -- -- -- 99 %  01/27/24 1031 (!) 116/57 -- -- 79 -- --  01/27/24 1030 -- -- -- -- -- 96 %  01/27/24 1029 120/67 -- -- 90 -- --  01/27/24 1025 -- -- -- -- -- 98 %  01/27/24 1020 -- -- -- -- -- 98 %  01/27/24 1015 -- -- -- -- -- 99 %  01/27/24 0912 -- 98.1 F (36.7 C) Oral -- -- --  01/27/24 0754 (!) 96/45 -- -- 67 -- --  01/27/24 0720 (!) 87/48 -- -- 68 15 --  01/27/24 0719 -- 97.7 F (36.5 C) Oral -- -- --  01/27/24 0555 -- -- -- -- -- 99 %  01/27/24 0146 115/75 98 F (36.7 C) Oral 75 16 --    Last SVE:  Dilation: 4.5 Effacement (%): 70 Cervical Position: Middle Station: -1 Presentation: Vertex Exam by:: Cinda Craze CNM -  , Rupture Date: 01/26/24, Rupture Time: 0700,    FHR:   - Baseline: 125 - Variability: moderate - Accels: present - Decels: deep recurrent variables, prolonged Category 2  UTERINE ACTIVITY:  Contractions: q 2-6 minutes  Phylliss Brenner, CNM

## 2024-01-27 NOTE — Transfer of Care (Signed)
 Immediate Anesthesia Transfer of Care Note  Patient: [name]  Procedure(s) Performed: CESAREAN DELIVERY  Patient Location: Mother/Baby  Anesthesia Type:Epidural  Level of Consciousness: awake, alert , and oriented  Airway & Oxygen Therapy: Patient Spontanous Breathing  Post-op Assessment: Report given to RN and Post -op Vital signs reviewed and stable  Post vital signs: Reviewed and stable  Last Vitals:  Vitals Value Taken Time  BP 106/75 01/27/24 1429  Temp    Pulse 84 01/27/24 1429  Resp 14 01/27/24 1429  SpO2 98 % 01/27/24 1429    Last Pain:  Vitals:   01/27/24 1429  TempSrc:   PainSc: Asleep      Patients Stated Pain Goal: 0 (01/27/24 0052)  Complications: No notable events documented.

## 2024-01-27 NOTE — Progress Notes (Signed)
 Patient ID: [name], adult   DOB: 01-Feb-2006, 18 y.o.   MRN: [id number] Deep variable decels with every ctx  cx only 4.5 cm dilated .  ROM 30 hr  FITL . Recommend cesarean section . I have explained my thinking with the patient . She is in agreement .  The risks of cesarean section discussed with the patient included but were not limited to: bleeding which may require transfusion or reoperation; infection which may require antibiotics; injury to bowel, bladder, ureters or other surrounding organs; injury to the fetus; need for additional procedures including hysterectomy in the event of a life-threatening hemorrhage; placental abnormalities wth subsequent pregnancies, incisional problems, thromboembolic phenomenon and other postoperative/anesthesia complications. The patient concurred with the proposed plan, giving informed written consent for the procedure.    Anesthesia and OR aware. Preoperative prophylactic antibiotics and SCDs ordered on call to the OR.  To OR when ready.

## 2024-01-27 NOTE — Progress Notes (Signed)
 Patient ID: [name], adult   DOB: 16-May-2006, 18 y.o.   MRN: [id number] Assuming care with CNM  + cx change and now CAt 1 fetal monitoring . IUPC in place . Prolonged ROM . Add pitocin after CLe to gain adequate ctx

## 2024-01-27 NOTE — Brief Op Note (Signed)
 01/27/2024  2:13 PM  PATIENT:  Melda Spore  18 y.o. adult  PRE-OPERATIVE DIAGNOSIS:  Fetal intolerance to labor   POST-OPERATIVE DIAGNOSIS: FITL   SGA    PROCEDURE:  Procedure(s): CESAREAN DELIVERY (N/A)  SURGEON:  Surgeons and Role:    * Aricela Bertagnolli, Joselyn Nicely, MD - Primary  PHYSICIAN ASSISTANT: Tillie Folk, CNM   ASSISTANTS: cst   ANESTHESIA:   epidural  EBL: QBL 490 cc  IOF 700cc     BLOOD ADMINISTERED:none  DRAINS: none   LOCAL MEDICATIONS USED:  MARCAINE     SPECIMEN:  No Specimen  DISPOSITION OF SPECIMEN:  N/A  COUNTS:  YES  TOURNIQUET:  * No tourniquets in log *  DICTATION: .Other Dictation: Dictation Number verbal   PLAN OF CARE: Admit to inpatient   PATIENT DISPOSITION:  PACU - hemodynamically stable.   Delay start of Pharmacological VTE agent (>24hrs) due to surgical blood loss or risk of bleeding: not applicable

## 2024-01-27 NOTE — Progress Notes (Signed)
 Labor Progress Note   ASSESSMENT/PLAN   [name] 18 y.o.   G1P0000  at [redacted]w[redacted]d here for PROM.  FWB:  - Fetal well being assessed: Category 2        GBS: - GBS negative  LABOR: -  Cervical ripening - Cook catheter in place - Discussed options with Hadlyn. There have been some gaps in the FHR tracing d/t position changes, movement. Reviewed that we would like to get a better idea of the baby's wellbeing d/t recurrent variable decelerations. Will restart Pitocin after establishing a Cat I tracing -Reviewed plan with Dr. Madelene Schanz, who is in agreement. - Pain Management: plans epidural - Anticipate SVD   Labor Progress ervical Exam  Last 3 exams Dil Eff Sta Exam Time  2 90 -1 0551  2.5 80 -1 0358  1.5 70 -1 2256      SUBJECTIVE/OBJECTIVE   SUBJECTIVE:  [name] is coping well. She is feeling some painful contractions.   OBJECTIVE: Vital Signs: Patient Vitals for the past 12 hrs:  BP Temp Temp src Pulse Resp SpO2  01/27/24 0754 (!) 96/45 -- -- 67 -- --  01/27/24 0720 (!) 87/48 -- -- 68 15 --  01/27/24 0719 -- 97.7 F (36.5 C) Oral -- -- --  01/27/24 0555 -- -- -- -- -- 99 %  01/27/24 0146 115/75 98 F (36.7 C) Oral 75 16 --  01/26/24 2145 100/73 97.6 F (36.4 C) Oral 83 14 --    Last SVE:  Dilation: 2 Effacement (%): 90 Cervical Position: Middle Station: -1 Presentation: Vertex Exam by:: Dr. Everardo Hitch -  , Rupture Date: 01/26/24, Rupture Time: 0700,    FHR:   - Baseline: 140 - Variability: moderate - Accels: present - Decels: variable Category 2  UTERINE ACTI bVITY:  Contractions: q 2-5  Phylliss Brenner, CNM

## 2024-01-27 NOTE — Anesthesia Preprocedure Evaluation (Addendum)
 Anesthesia Evaluation  Patient identified by MRN, date of birth, ID band Patient awake    Reviewed: Allergy & Precautions, NPO status , Patient's Chart, lab work & pertinent test results  Airway Mallampati: III  TM Distance: >3 FB Neck ROM: full    Dental  (+) Chipped, Dental Advidsory Given   Pulmonary neg pulmonary ROS   Pulmonary exam normal        Cardiovascular Exercise Tolerance: Good negative cardio ROS Normal cardiovascular exam     Neuro/Psych    GI/Hepatic negative GI ROS,,,  Endo/Other    Renal/GU   negative genitourinary   Musculoskeletal   Abdominal   Peds  Hematology negative hematology ROS (+)   Anesthesia Other Findings Past Medical History: No date: Infection No date: Patient denies medical problems  Past Surgical History: No date: NO PAST SURGERIES No date: NO PAST SURGERIES  BMI    Body Mass Index: 27.10 kg/m      Reproductive/Obstetrics (+) Pregnancy                             Anesthesia Physical Anesthesia Plan  ASA: 2  Anesthesia Plan: Epidural   Post-op Pain Management:    Induction:   PONV Risk Score and Plan:   Airway Management Planned: Natural Airway  Additional Equipment:   Intra-op Plan:   Post-operative Plan:   Informed Consent: I have reviewed the patients History and Physical, chart, labs and discussed the procedure including the risks, benefits and alternatives for the proposed anesthesia with the patient or authorized representative who has indicated his/her understanding and acceptance.     Dental Advisory Given  Plan Discussed with: Anesthesiologist  Anesthesia Plan Comments: (Patient reports no bleeding problems and no anticoagulant use.   Patient consented for risks of anesthesia including but not limited to:  - adverse reactions to medications - risk of bleeding, infection and or nerve damage from epidural that  could lead to paralysis - risk of headache or failed epidural - nerve damage due to positioning - that if epidural is used for C-section that there is a chance of epidural failure requiring spinal placement or conversion to GA - Damage to heart, brain, lungs, other parts of body or loss of life  Patient voiced understanding and assent.)       Anesthesia Quick Evaluation

## 2024-01-28 ENCOUNTER — Encounter: Payer: Self-pay | Admitting: Obstetrics and Gynecology

## 2024-01-28 ENCOUNTER — Ambulatory Visit

## 2024-01-28 DIAGNOSIS — O4202 Full-term premature rupture of membranes, onset of labor within 24 hours of rupture: Secondary | ICD-10-CM | POA: Diagnosis not present

## 2024-01-28 DIAGNOSIS — O99324 Drug use complicating childbirth: Secondary | ICD-10-CM | POA: Diagnosis not present

## 2024-01-28 DIAGNOSIS — O365930 Maternal care for other known or suspected poor fetal growth, third trimester, not applicable or unspecified: Secondary | ICD-10-CM | POA: Diagnosis not present

## 2024-01-28 DIAGNOSIS — F1290 Cannabis use, unspecified, uncomplicated: Secondary | ICD-10-CM | POA: Diagnosis not present

## 2024-01-28 LAB — CBC
HCT: 27.1 % — ABNORMAL LOW (ref 36.0–46.0)
Hemoglobin: 8.6 g/dL — ABNORMAL LOW (ref 12.0–15.0)
MCH: 26.7 pg (ref 26.0–34.0)
MCHC: 31.7 g/dL (ref 30.0–36.0)
MCV: 84.2 fL (ref 80.0–100.0)
Platelets: 251 10*3/uL (ref 150–400)
RBC: 3.22 MIL/uL — ABNORMAL LOW (ref 3.87–5.11)
RDW: 13.5 % (ref 11.5–15.5)
WBC: 16.1 10*3/uL — ABNORMAL HIGH (ref 4.0–10.5)
nRBC: 0 % (ref 0.0–0.2)

## 2024-01-28 MED ORDER — IRON SUCROSE 500 MG IVPB - SIMPLE MED
500.0000 mg | Freq: Once | INTRAVENOUS | Status: AC
  Administered 2024-01-28: 500 mg via INTRAVENOUS
  Filled 2024-01-28: qty 275

## 2024-01-28 MED ORDER — SODIUM CHLORIDE 0.9 % IV SOLN: INTRAVENOUS | Status: AC | PRN

## 2024-01-28 MED ORDER — DIPHENHYDRAMINE HCL 50 MG/ML IJ SOLN: 25.0000 mg | Freq: Once | INTRAMUSCULAR | Status: DC | PRN

## 2024-01-28 MED ORDER — EPINEPHRINE 0.3 MG/0.3ML IJ SOAJ: 0.3000 mg | Freq: Once | INTRAMUSCULAR | Status: DC | PRN

## 2024-01-28 MED ORDER — METHYLPREDNISOLONE SODIUM SUCC 125 MG IJ SOLR: 125.0000 mg | Freq: Once | INTRAMUSCULAR | Status: DC | PRN

## 2024-01-28 MED ORDER — ALBUTEROL SULFATE (2.5 MG/3ML) 0.083% IN NEBU: 2.5000 mg | INHALATION_SOLUTION | Freq: Once | RESPIRATORY_TRACT | Status: DC | PRN

## 2024-01-28 MED ORDER — SODIUM CHLORIDE 0.9 % IV BOLUS: 500.0000 mL | Freq: Once | INTRAVENOUS | Status: DC | PRN

## 2024-01-28 NOTE — Anesthesia Postprocedure Evaluation (Signed)
 Anesthesia Post Note  Patient: [name]  Procedure(s) Performed: CESAREAN DELIVERY  Patient location during evaluation: Mother Baby Anesthesia Type: Epidural Level of consciousness: awake and alert Pain management: pain level controlled Vital Signs Assessment: post-procedure vital signs reviewed and stable Respiratory status: spontaneous breathing, nonlabored ventilation and respiratory function stable Cardiovascular status: stable Postop Assessment: no headache, no backache and epidural receding Anesthetic complications: no   No notable events documented.   Last Vitals:  Vitals:   01/28/24 0300 01/28/24 0509  BP: 97/62 108/87  Pulse: 63 66  Resp: 18 20  Temp: 37.1 C 36.8 C  SpO2: 100% 97%    Last Pain:  Vitals:   01/28/24 0555  TempSrc:   PainSc: 4                  Usiel Astarita [address]

## 2024-01-28 NOTE — Progress Notes (Signed)
 Subjective:   [name] had a c/s on 01/27/24 for fetal intolerance with IOL for PROM. Has had routine postpartum care.  Pt. Is eating, hydrating, and voiding regularly without difficulty. Has yet to have BM. She is breastfeeding and supplementing with formula afterwards, states this was recommended by her nurse. Baby had some trouble latching last night but has successfully latched today and [name] was seen by lactation today which she found helpful. Reports small amount of vaginal bleeding, denies passing large blood clots. Has had some incisional pain relieved with tylenol and toradol. Also reports some pain to right shoulder which she thinks is likely referred gas pain. Plans to use OCPs for contraception. Denies anxiety/depression symptoms. Endorses good support from partner and family.  Denies feeling light headed or dizzy with standing. Has been walking around her room. Reports a good appetite, no nausea today.  Objective:  Vital signs in last 24 hours: Temp:  [98.2 F (36.8 C)-99.8 F (37.7 C)] 98.6 F (37 C) (05/15 1553) Pulse Rate:  [63-84] 84 (05/15 1553) Resp:  [18-20] 18 (05/15 1553) BP: (97-115)/(53-87) 115/74 (05/15 1553) SpO2:  [97 %-100 %] 99 % (05/15 1553)    General: NAD Pulmonary: no increased work of breathing Breasts: soft, non-tender, nipples without breakdown Abdomen: soft, non-tender Fundus: firm, midline, at umbilicus Dressing: pressure dressing on, dry and intact Lochia: light rubra, no clots Perineum: no erythema or foul odor discharge, minimal edema, laceration well approximated  Extremities: no edema, no erythema, no tenderness  Results for orders placed or performed during the hospital encounter of 01/26/24 (from the past 72 hours)  Rupture of Membrane (ROM) Plus     Status: None   Collection Time: 01/26/24 10:31 AM  Result Value Ref Range   Rom Plus POSITIVE     Comment: Performed at [facility], [address],  [place], [place] [address]  Urine Drug Screen, Qualitative ([facility] only)     Status: Abnormal   Collection Time: 01/26/24 10:31 AM  Result Value Ref Range   Tricyclic, Ur Screen NONE DETECTED NONE DETECTED   Amphetamines, Ur Screen NONE DETECTED NONE DETECTED   MDMA (Ecstasy)Ur Screen NONE DETECTED NONE DETECTED   Cocaine Metabolite,Ur [place] NONE DETECTED NONE DETECTED   Opiate, Ur Screen NONE DETECTED NONE DETECTED   Phencyclidine (PCP) Ur S NONE DETECTED NONE DETECTED   Cannabinoid 50 Ng, Ur [place] POSITIVE (A) NONE DETECTED   Barbiturates, Ur Screen NONE DETECTED NONE DETECTED   Benzodiazepine, Ur Scrn NONE DETECTED NONE DETECTED   Methadone Scn, Ur NONE DETECTED NONE DETECTED    Comment: (NOTE) Tricyclics + metabolites, urine    Cutoff 1000 ng/mL Amphetamines + metabolites, urine  Cutoff 1000 ng/mL MDMA (Ecstasy), urine              Cutoff 500 ng/mL Cocaine Metabolite, urine          Cutoff 300 ng/mL Opiate + metabolites, urine        Cutoff 300 ng/mL Phencyclidine (PCP), urine         Cutoff 25 ng/mL Cannabinoid, urine                 Cutoff 50 ng/mL Barbiturates + metabolites, urine  Cutoff 200 ng/mL Benzodiazepine, urine              Cutoff 200 ng/mL Methadone, urine                   Cutoff 300 ng/mL  The urine  drug screen provides only a preliminary, unconfirmed analytical test result and should not be used for non-medical purposes. Clinical consideration and professional judgment should be applied to any positive drug screen result due to possible interfering substances. A more specific alternate chemical method must be used in order to obtain a confirmed analytical result. Gas chromatography / mass spectrometry (GC/MS) is the preferred confirm atory method. Performed at [facility], [address] [place], [place] [address]   CBC     Status: Abnormal   Collection Time: 01/26/24 12:19 PM  Result Value Ref Range   WBC 12.7 (H) 4.0 - 10.5 K/uL   RBC 4.32 3.87 - 5.11  MIL/uL   Hemoglobin 11.8 (L) 12.0 - 15.0 g/dL   HCT 27.2 (L) 53.6 - 64.4 %   MCV 82.6 80.0 - 100.0 fL   MCH 27.3 26.0 - 34.0 pg   MCHC 33.1 30.0 - 36.0 g/dL   RDW 03.4 74.2 - 59.5 %   Platelets 324 150 - 400 K/uL   nRBC 0.0 0.0 - 0.2 %    Comment: Performed at [facility], [address] [place], [place] [address]  Type and screen [facility]     Status: None   Collection Time: 01/26/24 12:19 PM  Result Value Ref Range   ABO/RH(D) A POS    Antibody Screen NEG    Sample Expiration      01/29/2024,2359 Performed at [facility], [address] [place], [place] [address]   RPR     Status: None   Collection Time: 01/26/24 12:19 PM  Result Value Ref Range   RPR Ser Ql NON REACTIVE NON REACTIVE    Comment: Performed at [facility], [address] [place], [place] [address]  ABO/Rh     Status: None   Collection Time: 01/26/24  1:10 PM  Result Value Ref Range   ABO/RH(D)      A POS Performed at [facility], [address] [place], [place] [address]   CBC     Status: Abnormal   Collection Time: 01/28/24  4:22 AM  Result Value Ref Range   WBC 16.1 (H) 4.0 - 10.5 K/uL   RBC 3.22 (L) 3.87 - 5.11 MIL/uL   Hemoglobin 8.6 (L) 12.0 - 15.0 g/dL    Comment: REPEATED TO VERIFY   HCT 27.1 (L) 36.0 - 46.0 %   MCV 84.2 80.0 - 100.0 fL   MCH 26.7 26.0 - 34.0 pg   MCHC 31.7 30.0 - 36.0 g/dL   RDW 63.0 16.0 - 10.9 %   Platelets 251 150 - 400 K/uL   nRBC 0.0 0.0 - 0.2 %    Comment: Performed at [facility], [address], [place], [place] [address]    Assessment:   18 y.o. G1P1001 1 day(s)  s/p c/s Breastfeeding/supplementing with formula Anemia secondary to acute blood loss- hemodynamically stable and symptomatic VSS Pain well controlled Positive UDS  Plan:    IV iron ordered for anemia, discussed with patient who is in agreement Blood Type --/--/A POS Performed at [facility], [address] [place], [place] [address]  (05/13 1310) / Rubella 2.08 (11/12 1049) / Varicella Unknown Rhogam not indicated Tdap/varicella/rubella to be offered before discharge if indicated Feeding plan breast & bottle, lactation support Encouraged to continue breastfeeding, BF education on latch, position changes, cluster feeding, hunger cues, lactogenesis II, milk supply Education given regarding options for contraception, as well  as compatibility with breast feeding if applicable.  Patient plans on OCP (estrogen/progesterone) for contraception. Continued routine postpartum care  Counseled on normal uterine involution and vaginal bleeding postpartum Transition of care ordered due to positive UDS for [facility] Anticipate discharge home tomorrow or possibly 01/30/24    Lou Rounds, FNP, CNM [facility] 01/28/2024, 5:43 PM

## 2024-01-28 NOTE — Lactation Note (Signed)
 This note was copied from a baby's chart. Lactation Consultation Note  Patient Name: Girl Essiemae Mater [id number] Date: 01/28/2024 Age:19 hours Reason for consult: Follow-up assessment;Primapara;Early term 37-38.6wks   Maternal Data Follow up assessment w/ 18hr old baby girl and parents.  Patient stated that she is currently supplementing with formula after infant goes to the breast.  She feels breastfeeding is going ok.    Feeding Mother's Current Feeding Choice: Breast Milk and Formula Nipple Type: Slow - flow  Patient just finished feeding infant w/ a bottle of formula prior to arrival in the room.   Interventions Interventions: Breast feeding basics reviewed;Education;CDC milk storage guidelines  LC reviewed milk production expectations to parents.  Breastmilk storage guidelines were provided to parents.   Consult Status Consult Status: Follow-up Follow-up type: In-patient    Evertt Chouinard S Curties Conigliaro 01/28/2024, 1:45 PM

## 2024-01-28 NOTE — Op Note (Signed)
 NAMESANII, [name] MEDICAL RECORD NO: [id number] ACCOUNT NO: [id number] DATE OF BIRTH: 11-Aug-2006 FACILITY: [facility] LOCATION: [facility] PHYSICIAN: Carolynn Citrin, MD  Operative Report   DATE OF PROCEDURE: 01/26/2024  PREOPERATIVE DIAGNOSES: 1. 38+6 weeks estimated gestational age. 2. Prolonged rupture of membranes. 3. Fetal intolerance of labor. 4. Small for gestational age, growth restriction.  POSTOPERATIVE DIAGNOSES: 1. 38+6 weeks estimated gestational age. 2. Prolonged rupture of membranes. 3. Fetal intolerance of labor. 4. Small for gestational age, growth restriction. 5. Vigorous female delivered.  PROCEDURE: Primary low transverse cesarean section.  ANESTHESIA: Surgical dosing of continuous lumbar epidural.  SURGEON: Carolynn Citrin, MD  FIRST ASSISTANT: Tillie Folk, certified nurse midwife.  INDICATIONS: An 18 year old gravida 1, para 0. The patient at 38+6 weeks estimated gestational age patient presented today prior to the procedure with rupture of membranes. The patient had prolonged rupture of membranes for 30 hours, and during labor,  early active labor, developed nonreassuring fetal monitoring with deep variable decelerations with contractions.  DESCRIPTION OF PROCEDURE: After adequate surgical dosing of continuous lumbar epidural, the patient was placed in dorsal supine position hip roll on the right side. The patient's vagina and abdomen were prepped and draped in normal sterile fashion. The  patient did receive 2 g of IV Ancef  and 500 mg of azithromycin  for surgical prophylaxis. Timeout was performed. A Pfannenstiel incision was made two fingerbreadths above the symphysis pubis. Sharp dissection was used to identify the fascia. The fascia  was opened in the midline and opened in a transverse fashion. The superior aspect of the fascia was grasped with Kocher clamps, and the recti muscles were dissected free. The inferior aspect of the fascia was  grasped with Kocher clamps. The pyramidalis  muscle was dissected free. Entry into the peritoneal cavity was accomplished sharply. The vesicouterine peritoneal fold was identified and opened, and a bladder flap was created. The bladder was reflected inferiorly. A low transverse uterine incision was  made. Upon entry into the endometrial cavity, clear fluid resulted. The incision was extended with blunt transverse traction. The fetal head was brought to the incision, and with fundal pressure, the head was delivered, and shoulders and body were  delivered without difficulty. A vigorous female was then dried on the mother's abdomen for 60 seconds. Of note, the umbilical cord was extremely thin and small. The cord was doubly clamped, and the vigorous female was passed to the nursery staff, signed  Apgar scores of 9 and 9. The placenta was manually delivered, and the uterus was exteriorized and wiped clean with laparotomy tape. The uterine incision was then closed with 1 chromic suture in a running locking fashion. Two additional figure-of-eight  sutures were required for hemostasis. The fallopian tubes and ovaries appeared normal. The posterior cul-de-sac was irrigated and suctioned, and the uterus was placed back into the abdominal cavity. The paracolic gutters were wiped clean with laparotomy  tape. The uterine incision again appeared hemostatic. The fascia was then closed with 0 Vicryl suture in a running nonlocking fashion. Good approximation of edges. The fascial edges were injected with a solution of 60 mL of 0.25% Marcaine  plus 20 mL of  normal saline. Approximately 30 mL of solution was injected into the fascial edges. Subcutaneous tissues were then irrigated and Bovied, and the skin was reapproximated with Insorb absorbable staples. An additional 20 mL of Marcaine  solution was then  injected beneath the skin. The patient did receive 30 mg of Toradol  at the end of the procedure. CNM  Swanson ,first  assist, necessary for her expertise in retraction and use of fundal pressure for delivery of the baby   QUANTITATIVE BLOOD LOSS: 490 mL.  INTRAOPERATIVE FLUIDS: 700 mL.  DISPOSITION: The patient tolerated the procedure well and was taken to the recovery room in good condition.    SUJ D: 01/27/2024 2:28:23 pm T: 01/28/2024 2:07:00 am  JOB: [id number]/ [id number]

## 2024-01-29 DIAGNOSIS — O99324 Drug use complicating childbirth: Secondary | ICD-10-CM | POA: Diagnosis not present

## 2024-01-29 DIAGNOSIS — O4202 Full-term premature rupture of membranes, onset of labor within 24 hours of rupture: Secondary | ICD-10-CM | POA: Diagnosis not present

## 2024-01-29 DIAGNOSIS — O365930 Maternal care for other known or suspected poor fetal growth, third trimester, not applicable or unspecified: Secondary | ICD-10-CM | POA: Diagnosis not present

## 2024-01-29 DIAGNOSIS — F1290 Cannabis use, unspecified, uncomplicated: Secondary | ICD-10-CM | POA: Diagnosis not present

## 2024-01-29 NOTE — Lactation Note (Signed)
 This note was copied from a baby's chart. Lactation Consultation Note  Patient Name: [name] Date: 01/29/2024 Age:19 hours Reason for consult: Follow-up assessment;Mother's request;Primapara;Early term 37-38.6wks;Infant < 6lbs;Breastfeeding assistance;RN request;Other (Comment) (39 year old mom)   Maternal Data This is 40 year old mom's 1st baby, primary C/S for FHR indication. 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   On follow-up today mom reports her breasts are leaking and feel full. Mom reports her nipples are sore, compression stripes noted on both nipples to have cracked. Mom applying coconut oil to both nipples. Also, mom reports baby at times gets choked up when feeding. LC assisted mom with breastfeeding.  Has patient been taught Hand Expression?: Yes Does the patient have breastfeeding experience prior to this delivery?: No  Feeding Mother's Current Feeding Choice: Breast Milk Provided mom with tips and strategies to maximize position and latch techniques. Instructed mom on a modified football hold that keeps baby upright and mom able to lean back when mom's milk lets down. Baby could be heard gulping milk. Mom's milk is in on day 2. Baby breast fed well and had 3 brief episodes where she sputtered slightly at the breast and was able to recover  herself and kept feeding.Mom post feed was uncomfortable . Assisted mom with pumping to comfort in which mom pumped 30 ml's from the right breast and 25 ml's from the left breast in 3 minutes. Pump turned off and assisted mother with applying ice packs to both breasts to manage slight breast engorgement. Mom reports she is feeling more confident with breastfeeding.  LATCH Score Latch: Grasps breast easily, tongue down, lips flanged, rhythmical sucking.  Audible Swallowing: Spontaneous and intermittent  Type of Nipple: Everted at rest and  after stimulation  Comfort (Breast/Nipple): Engorged, cracked, bleeding, large blisters, severe discomfort  Hold (Positioning): Assistance needed to correctly position infant at breast and maintain latch.  LATCH Score: 7   Lactation Tools Discussed/Used Tools: Pump Breast pump type: Double-Electric Breast Pump;Manual Pump Education: Setup, frequency, and cleaning;Milk Storage Reason for Pumping: Assisted mom with pumping a few minutes to comfort. Also, mom  interested in knowing how the pump works. Pumping frequency: as needed to comfort only or if at a feeding time baby refused to latch and feed. Pumped volume: 55 mL (Mom pumped 30 ml's from one breast and 25 ml's from the other breast . Total pump time was 3 minutes.)  Interventions Interventions: Breast feeding basics reviewed;Assisted with latch;Breast massage;Hand express;Breast compression;Adjust position;Support pillows;Position options;Expressed milk;Coconut oil;Hand pump;DEBP;Ice;Education  Discharge Discharge Education: Engorgement and breast care;Warning signs for feeding baby;Outpatient recommendation Pump: Manual;Personal  Consult Status Consult Status: PRN Date: 01/30/24 Follow-up type: In-patient  Update provided to care nurse.  Angelica Kemp 01/29/2024, 6:05 PM

## 2024-01-29 NOTE — TOC Initial Note (Signed)
 Transition of Care [facility]) - Initial/Assessment Note    Patient Details  Name: [name] MRN: [id number] Date of Birth: 2006/06/22  Transition of Care [facility] CM/SW Contact:    [place] L Dagoberto Nealy, LCSW Phone Number: 01/29/2024, 11:54 AM  Clinical Narrative:                  CSW reviewed HIPAA rights with mother. Father of baby was at bedside. Mother gave verbal consent to share information with FOB. Mother reports that she is feeling better today. She advised that yesterday she was exhausted but today she is rested.   Mother confirmed her address to be [address] [place], [place] [address]. Her phone number is [phone].   Mother resides with her partner, his parents and his nephew. Mother advised that there is adequate space in the home for her and the baby.   Mother is a HS graduate and she works at [employer]. Mother reports that she had WIC previously but stated that she needed to reapply.   Mother reported having ADD in middle school but denies current concerns and taking prescribed medications. Resources for PPD and mental health services were provided at bedside.   Mother advised that she has an active drivers license and her own vehicle. Safe sleeping was discussed and mother was provided with resources. She reported having a bassinet and a pack and play for baby to sleep in. Safe sleeping information provided to mother at bedside.   Mother plans to schedule follow-up appointment for baby at a [facility] in [place]. Mother was encouraged to schedule that appointment today.   Mother reported that smoking THC is regular practice for her. She stated that she last indulged about two weeks ago. She denied using/abusing other drugs. She advised that she smoked during pregnancy because she was experiencing nausea.        Patient Goals and CMS Choice Patient states their goals for this hospitalization and ongoing recovery are:: Discharging home with baby           Expected Discharge Plan and Services In-house Referral: Clinical Social Work                                            Prior Living Arrangements/Services   Lives with:: Self, Significant Other, Relatives Patient language and need for interpreter reviewed:: Yes (Patient speaks English) Do you feel safe going back to the place where you live?: Yes      Need for Family Participation in Patient Care: No (Comment) Care giver support system in place?: Yes (comment)      Activities of Daily Living   ADL Screening (condition at time of admission) Independently performs ADLs?: Yes (appropriate for developmental age) Is the patient deaf or have difficulty hearing?: No Does the patient have difficulty seeing, even when wearing glasses/contacts?: No Does the patient have difficulty concentrating, remembering, or making decisions?: No  Permission Sought/Granted Permission sought to share information with : Family Supports Permission granted to share information with : Yes, Verbal Permission Granted  Share Information with NAME: Tanis Cordless           Emotional Assessment Appearance:: Appears stated age Attitude/Demeanor/Rapport: Engaged Affect (typically observed): Accepting Orientation: : Oriented to Self, Oriented to Place, Oriented to  Time, Oriented to Situation Alcohol / Substance Use: Other (comment) (THC) Psych Involvement: No (comment)  Admission diagnosis:  Vaginal discharge during pregnancy [O26.899, N89.8] PROM (premature rupture of membranes) [O42.90] Patient Active Problem List   Diagnosis Date Noted   Premature rupture of membranes 01/26/2024   PROM (premature rupture of membranes) 01/26/2024   Intrauterine growth restriction (IUGR) affecting care of mother, EFW 8% 01/08/2024   Abnormal finding on ultrasound: single umbilical artery on 09/11/23 u/s 09/11/2023   Vapes nicotine containing substance 08/25/2023   Positive urine drug screen  07/28/23 +MJ; +MJ 08/25/23 08/03/2023   UTI (urinary tract infection) during pregnancy E. Coli 07/28/23 08/03/2023   Supervision of normal first pregnancy, antepartum 07/28/2023   PCP:  Patient, No Pcp Per Pharmacy:   [facility] - [place], [place] - [address] [place] [address] Phone: [phone] Fax: [phone]     Social Drivers of Health (SDOH) Social History: SDOH Screenings   Food Insecurity: No Food Insecurity (01/26/2024)  Housing: Low Risk  (01/26/2024)  Transportation Needs: No Transportation Needs (01/26/2024)  Utilities: Not At Risk (01/26/2024)  Depression (PHQ2-9): Low Risk  (07/10/2023)  Financial Resource Strain: Low Risk  (07/28/2023)  Social Connections: Unknown (07/28/2023)  Tobacco Use: Medium Risk (01/26/2024)   SDOH Interventions:     Readmission Risk Interventions     No data to display

## 2024-01-29 NOTE — TOC Progression Note (Signed)
 Transition of Care [facility]) - Progression Note    Patient Details  Name: [name] MRN: [id number] Date of Birth: Feb 08, 2006  Transition [facility] CM/SW Contact  Fransisco Messmer A Jiyan Walkowski, RN Phone Number: 01/29/2024, 2:46 PM  Clinical Narrative:    Chart reviewed. I have spoken with Trula Gable with [facility] CPS. CPS referral called for mother with substance abuse use during pregnancy and positive drug screen on admission. Mother and Baby toxicology reports are positive for Cannabinoid. CPS will follow up on referral.  Mother is able to discharge home with baby.   TOC will continue to follow.        Expected Discharge Plan and Services In-house Referral: Clinical Social Work                                             Social Determinants of Health (SDOH) Interventions SDOH Screenings   Food Insecurity: No Food Insecurity (01/26/2024)  Housing: Low Risk  (01/26/2024)  Transportation Needs: No Transportation Needs (01/26/2024)  Utilities: Not At Risk (01/26/2024)  Depression (PHQ2-9): Low Risk  (07/10/2023)  Financial Resource Strain: Low Risk  (07/28/2023)  Social Connections: Unknown (07/28/2023)  Tobacco Use: Medium Risk (01/26/2024)    Readmission Risk Interventions     No data to display

## 2024-01-29 NOTE — Progress Notes (Signed)
 Progress Note - Cesarean Delivery  [name] is a 18 y.o. G1P1001 now PP day 2 s/p C-Section, Low Transverse.   Subjective:  Patient reports no problems with eating, bowel movements, voiding, or their wound    Objective:  Vital signs in last 24 hours: Temp:  [98.6 F (37 C)-99.1 F (37.3 C)] 98.7 F (37.1 C) (05/16 0819) Pulse Rate:  [71-84] 71 (05/16 0819) Resp:  [18-20] 20 (05/16 0819) BP: (105-119)/(63-76) 119/76 (05/16 0819) SpO2:  [99 %] 99 % (05/16 0819)  Physical Exam:  General: alert Lochia: appropriate Uterine Fundus: firm Incision: healing well, no significant drainage    Data Review Recent Labs    01/28/24 0422  HGB 8.6*  HCT 27.1*    Assessment:  Active Problems:   Abnormal finding on ultrasound: single umbilical artery on 09/11/23 u/s     Overview: Needs weekly NST or BPP from 36 weeks     Serial growth u/s q 4 wks from 32 wks     12/09/23 @ 32 weeks- EFW 16%, growth lag in extremities without bowing-      ordered follow up for 3 weeks      01/06/24- EFW 8%, BPP 8/8   Intrauterine growth restriction (IUGR) affecting care of mother, EFW 8%     Overview: 01/06/24- consult with MFM at [facility], EFW 8%, BPP 8/8, UA doppler normal          -weekly NST or BPP with UA Doppler weekly [ weekly BPP and dopplers      ordered]     -growth US  q3-4 weeks     Delivery at [redacted]w[redacted]d-[redacted]w[redacted]d with EFW 3-10%   Premature rupture of membranes   PROM (premature rupture of membranes)   Status post Cesarean section. Doing well postoperatively.     Plan:       Continue current care.  Discharge home tomorrow  Donato Fu, CNM 01/29/2024 3:47 PM

## 2024-01-30 DIAGNOSIS — O4202 Full-term premature rupture of membranes, onset of labor within 24 hours of rupture: Secondary | ICD-10-CM | POA: Diagnosis not present

## 2024-01-30 DIAGNOSIS — O365930 Maternal care for other known or suspected poor fetal growth, third trimester, not applicable or unspecified: Secondary | ICD-10-CM | POA: Diagnosis not present

## 2024-01-30 DIAGNOSIS — O99324 Drug use complicating childbirth: Secondary | ICD-10-CM | POA: Diagnosis not present

## 2024-01-30 DIAGNOSIS — F1290 Cannabis use, unspecified, uncomplicated: Secondary | ICD-10-CM | POA: Diagnosis not present

## 2024-01-30 MED ORDER — OXYCODONE HCL 5 MG PO TABS: 5.0000 mg | ORAL_TABLET | ORAL | 0 refills | Status: AC | PRN

## 2024-01-30 MED ORDER — NORETHINDRONE 0.35 MG PO TABS: 1.0000 | ORAL_TABLET | Freq: Every day | ORAL | 11 refills | Status: AC

## 2024-01-30 NOTE — Lactation Note (Signed)
 This note was copied from a baby's chart. Lactation Consultation Note  Patient Name: [name] [id number] Date: 01/30/2024 Age:19 hours Reason for consult: Follow-up assessment;Primapara;Early term 37-38.6wks;Infant < 6lbs;Maternal discharge   Maternal Data Has patient been taught Hand Expression?: Yes Does the patient have breastfeeding experience prior to this delivery?: No  Feeding Mother's Current Feeding Choice: Breast Milk Baby had just finished nursing, was asleep with mom, mom secure about latching and pumping, milk supply in and increasing, breast softens with nursing or pumping. , mom ready for d/c LATCH Score Latch:  (no breastfeeding observed, had just fed)                  Lactation Tools Discussed/Used    Interventions Interventions: Education LC name  updated on white board Discharge Pump: Personal WIC Program: No (plans to apply)  Consult Status Consult Status: PRN Date: 01/30/24 Follow-up type: In-patient    Leoma Raja 01/30/2024, 12:58 PM

## 2024-01-30 NOTE — Progress Notes (Signed)
 Discharge instructions reviewed with patient.  Questions answered and follow up care reviewed. Printed copies given to patient for reference after discharge home.

## 2024-02-01 ENCOUNTER — Telehealth: Payer: Self-pay | Admitting: Obstetrics

## 2024-02-01 NOTE — Telephone Encounter (Signed)
-----   Message from [place] sent at 01/30/2024  9:30 AM EDT ----- Please schedule pt with Missy for 1 week incision check , 2 week vv , and 6 wk PPV   Ty Ivin Marrow

## 2024-02-01 NOTE — Telephone Encounter (Signed)
 Contacted the patient via phone. Left message for the patient to contact our office to scheduled postpartum visits. Offering 5/21 at 1:15 pm with Melissa Swanson for 1 week incision check and 5/28 at my chart visit for 2 week PP with Melissa.

## 2024-02-02 NOTE — Telephone Encounter (Signed)
 Contacted the patient via phone, left message for the patient to call back to confirm appointments that are scheduled.

## 2024-02-03 ENCOUNTER — Ambulatory Visit: Admitting: Obstetrics

## 2024-02-03 ENCOUNTER — Ambulatory Visit: Admitting: Certified Nurse Midwife

## 2024-02-03 ENCOUNTER — Telehealth: Payer: Self-pay | Admitting: Obstetrics

## 2024-02-03 DIAGNOSIS — Z1332 Encounter for screening for maternal depression: Secondary | ICD-10-CM | POA: Diagnosis not present

## 2024-02-03 NOTE — Telephone Encounter (Signed)
 Reached out to pt to reschedule 1 week incision check that was scheduled on 02/03/2024 at 1:!5 with Cinda Craze.  Left message for pt to call back.

## 2024-02-03 NOTE — Progress Notes (Signed)
   Postoperative Cesarean Follow-up Visit Larose Shenita Trego is a 18 y.o. G1P1001 s/p pLTCS at [redacted]w[redacted]d for fetal intolerance of labor, POD#6, here today for incision check.  Subjective: Pain is controlled with current analgesics. Medications being used: acetaminophen . She denies fever, chills, nausea, and vomiting. Eating a regular diet without difficulty.  Is having regular bowel movements. Activity: normal activities of daily living. Bleeding is light, without large clots or odor. She denies issues with her incision.  Breastfeeding daughter [place] without problem.  Objective: BP 112/77 (BP Location: Right Arm, Patient Position: Sitting, Cuff Size: Normal)   Pulse (!) 139   Wt 134 lb (60.8 kg)   Breastfeeding Yes   BMI 23.74 kg/m  Body mass index is 23.74 kg/m.  General:  alert and no distress  Abdomen: soft, bowel sounds active, non-tender  Incision:   healing well, no drainage, no erythema, no hernia, no seroma, no swelling, no dehiscence, incision well approximated; Honeycomb dressing removed.    Assessment/Plan: Joscelin Fray is a 18 y.o. G1P1001 s/p pLTCS at [redacted]w[redacted]d for fetal intolerance of labor, POD#6, here today for incision check, healing well. No concerns with incision today, remaining steri-strips removed and replaced.  -Discussed at-home care, healing expectations, si/sx of infection.  -Call clinic if developing redness, discharge, or increasing pain. -Avoid vigorous scrubbing/washing of incision site; hygiene reviewed. -May resume driving and light walking. Still no heavy lifting >10-12 lbs and pelvic rest advised until cleared at 6wk postpartum visit.  -If stooling regularly x 1-2 weeks, can take stool softener every other day x 1 week, taper as tolerated. -RTC 5wks for postpartum visit, sooner prn.   No follow-ups on file.   Forestine Igo, CNM

## 2024-02-04 NOTE — Telephone Encounter (Signed)
 Pt was seen on 02/03/2024 at 3:35 with Binnie Buffalo.

## 2024-02-11 ENCOUNTER — Telehealth (INDEPENDENT_AMBULATORY_CARE_PROVIDER_SITE_OTHER): Admitting: Obstetrics

## 2024-02-11 NOTE — Progress Notes (Signed)
   Virtual Visit via Video Note   I connected with [name]  on 02/11/24 1:38 PM by a video enabled telemedicine application and verified that I am speaking with the correct person using two identifiers.   Location: Patient: home Provider: [facility]   I discussed the limitations of evaluation and management by telemedicine and the availability of in person appointments. The patient expressed understanding and agreed to proceed. Subjective:    Subjective  [name] is a 18 y.o. female who presents for a postpartum visit. She is2 weeks  postpartum following a low cervical transverse Cesarean section. I have fully reviewed the prenatal and intrapartum course. The delivery was at [redacted]w[redacted]d .  Anesthesia: epidural and spinal.    Postpartum course has been normal. She has no pain and no other concerns. Baby's course has been normal. Baby is feeding by  breast. Bleeding is light. Bowel function is normal. Bladder function is normal.  .  Contraception method: oral progesterone-only contraceptive. Postpartum depression screening:      Review of Systems Pertinent positives noted in HPI. Remainder of comprehensive ROS otherwise negative.     Objective:      Objective [] Expand by Default There were no vitals taken for this visit.  General:  alert, cooperative, and no distress        Assessment:    Normal postpartum course, mood stable.   Plan:      Plan -Anticipatory guidance about the postpartum period - POPs have been ordered -Reviewed s/s of PPD and PPA -Discussed when to seek medical care -Office visit at 6 weeks postpartum  I discussed the assessment and treatment plan with the patient. The patient was provided an opportunity to ask questions and all were answered. The patient agreed with the plan and demonstrated an understanding of the instructions.   The patient was advised to call back or seek an in-person evaluation if the symptoms worsen or if the  condition fails to improve as anticipated.   I provided 8 minutes of non-face-to-face time during this encounter.     Jefte Carithers M Kenta Laster, CNM

## 2024-03-10 ENCOUNTER — Telehealth: Payer: Self-pay | Admitting: Obstetrics

## 2024-03-10 ENCOUNTER — Ambulatory Visit: Admitting: Obstetrics

## 2024-03-10 NOTE — Telephone Encounter (Signed)
 Reached out to pt to reschedule 6 week PP visit that was scheduled on 03/09/2024 at 1:55 with EMERSON Canny.  Left message for pt to call back.

## 2024-04-25 NOTE — Telephone Encounter (Signed)
 Pt did not come the 6 week pp visit that was scheduled on 03/10/24 with Missy Swanson.

## 2024-05-31 ENCOUNTER — Ambulatory Visit

## 2024-05-31 VITALS — BP 106/75 | HR 134 | Ht 63.0 in | Wt 134.5 lb

## 2024-05-31 DIAGNOSIS — R399 Unspecified symptoms and signs involving the genitourinary system: Secondary | ICD-10-CM

## 2024-05-31 DIAGNOSIS — R82998 Other abnormal findings in urine: Secondary | ICD-10-CM

## 2024-05-31 LAB — POCT URINALYSIS DIPSTICK
Bilirubin, UA: NEGATIVE
Glucose, UA: NEGATIVE
Ketones, UA: POSITIVE
Nitrite, UA: NEGATIVE
Protein, UA: POSITIVE — AB
Spec Grav, UA: 1.025 (ref 1.010–1.025)
Urobilinogen, UA: 0.2 U/dL
pH, UA: 6 (ref 5.0–8.0)

## 2024-05-31 NOTE — Patient Instructions (Signed)

## 2024-05-31 NOTE — Progress Notes (Signed)
    NURSE VISIT NOTE  Subjective:    Patient ID: [name], female    DOB: Jan 01, 2006, 18 y.o.   MRN: [id number]       HPI  Patient is a 18 y.o. G29P1001 female who presents for dysuria and urinary pressure and odor for 4 days. Reports no longer symptomatic.  Patient does have a history of recurrent UTI.  Patient does have a history of pyelonephritis.    Objective:    BP 106/75   Pulse (!) 134   Ht 5' 3 (1.6 m)   Wt 134 lb 8 oz (61 kg)   LMP 05/27/2024   Breastfeeding Yes   BMI 23.83 kg/m    Lab Review  Results for orders placed or performed in visit on 05/31/24  POCT Urinalysis Dipstick  Result Value Ref Range   Color, UA yellow    Clarity, UA clear    Glucose, UA Negative Negative   Bilirubin, UA neg    Ketones, UA pos    Spec Grav, UA 1.025 1.010 - 1.025   Blood, UA large    pH, UA 6.0 5.0 - 8.0   Protein, UA Positive (A) Negative   Urobilinogen, UA 0.2 0.2 or 1.0 E.U./dL   Nitrite, UA neg    Leukocytes, UA Small (1+) (A) Negative   Appearance     Odor      Assessment:   1. UTI symptoms   2. Leukocytes in urine      Plan:   Urine Culture Sent. Maintain adequate hydration.  May use AZO OTC prn.  Follow up if symptoms worsen or fail to improve as anticipated, and as needed.    Waddell JONELLE Maxim, CMA

## 2024-06-03 ENCOUNTER — Other Ambulatory Visit: Payer: Self-pay | Admitting: Certified Nurse Midwife

## 2024-06-03 ENCOUNTER — Encounter: Payer: Self-pay | Admitting: Certified Nurse Midwife

## 2024-06-03 LAB — URINE CULTURE

## 2024-06-03 MED ORDER — NITROFURANTOIN MONOHYD MACRO 100 MG PO CAPS: 100.0000 mg | ORAL_CAPSULE | Freq: Two times a day (BID) | ORAL | 0 refills | Status: AC

## 2024-10-20 ENCOUNTER — Ambulatory Visit

## 2024-10-20 VITALS — BP 114/60 | Ht 63.0 in | Wt 109.5 lb

## 2024-10-20 DIAGNOSIS — Z3201 Encounter for pregnancy test, result positive: Secondary | ICD-10-CM

## 2024-10-20 LAB — PREGNANCY, URINE: Preg Test, Ur: POSITIVE — AB

## 2024-10-20 MED ORDER — PRENATAL 27-0.8 MG PO TABS: 1.0000 | ORAL_TABLET | Freq: Every day | ORAL | Status: AC

## 2024-10-20 NOTE — Progress Notes (Signed)
 In nurse clinic for UPT UPT positive. Pt is planning on prenatal care at [facility] encouraged pt to call make appt.Sent to clerk for presumptive eligibility.  Per pt not on a BCM ,was currently breastfeeding 42mo . Was not trying to become pregnant. Positive pregnancy packet given to pt and reviewed info. Copy of NCIR given to pt.  The patient was dispensed PNV #100 given today. I provided counseling today regarding the medication. We discussed the medication, the side effects and when to call clinic. Patient given the opportunity to ask questions. Questions answered.
